# Patient Record
Sex: Female | Born: 2000 | Race: Black or African American | Hispanic: No | Marital: Single | Smoking: Never smoker
Health system: Southern US, Community
[De-identification: ages and names within clinical notes are randomized; demographics above are authoritative.]

## PROBLEM LIST (undated history)

## (undated) ENCOUNTER — Inpatient Hospital Stay (HOSPITAL_COMMUNITY): Payer: Self-pay

## (undated) DIAGNOSIS — O210 Mild hyperemesis gravidarum: Secondary | ICD-10-CM

## (undated) DIAGNOSIS — F819 Developmental disorder of scholastic skills, unspecified: Secondary | ICD-10-CM

## (undated) DIAGNOSIS — R87629 Unspecified abnormal cytological findings in specimens from vagina: Secondary | ICD-10-CM

## (undated) HISTORY — DX: Developmental disorder of scholastic skills, unspecified: F81.9

---
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
## 2018-01-01 NOTE — BH Specialist Note (Deleted)
Integrated Behavioral Health Follow Up Visit  MRN: [id number] Name: Deanna Cooper  Number of Integrated Behavioral Health Clinician visits: 2/6 Session Start time: ***  Session End time: *** Total time: {IBH Total Time:21014050}  Type of Service: Integrated Behavioral Health- Individual/Family Interpretor:{yes [id number]}no:[id number]} Interpretor Name and Language: ***  SUBJECTIVE: Deanna Gottroniye Peer is a 17 y.o. female accompanied by {Patient accompanied [phone]} Patient was referred by *** for ***. Patient reports the following symptoms/concerns: *** Duration of problem: ***; Severity of problem: {Mild/Moderate/Severe:20260}  OBJECTIVE: Mood: {BHH MOOD:22306} and Affect: {BHH AFFECT:22307} Risk of harm to self or others: {CHL AMB BH Suicide Current Mental Status:21022748}  LIFE CONTEXT: Family and Social: *** School/Work: *** Self-Care: *** Life Changes: ***  GOALS ADDRESSED: Patient will: 1.  Reduce symptoms of: {IBH Symptoms:21014056}  2.  Increase knowledge and/or ability of: {IBH Patient Tools:21014057}  3.  Demonstrate ability to: {IBH Goals:21014053}  INTERVENTIONS: Interventions utilized:  {IBH Interventions:21014054} Standardized Assessments completed: {IBH Screening Tools:21014051}  ASSESSMENT: Patient currently experiencing ***.   Patient may benefit from ***.  PLAN: 1. Follow up with behavioral health clinician on : *** 2. Behavioral recommendations: *** 3. Referral(s): {IBH Referrals:21014055} 4. "From scale of 1-10, how likely are you to follow plan?": ***  Valetta CloseJamie C McMannes, LCSW

## 2018-01-02 ENCOUNTER — Ambulatory Visit: Payer: Medicaid Other

## 2018-05-15 DIAGNOSIS — Z1388 Encounter for screening for disorder due to exposure to contaminants: Secondary | ICD-10-CM | POA: Diagnosis not present

## 2018-05-15 DIAGNOSIS — Z3009 Encounter for other general counseling and advice on contraception: Secondary | ICD-10-CM | POA: Diagnosis not present

## 2018-05-15 DIAGNOSIS — Z0389 Encounter for observation for other suspected diseases and conditions ruled out: Secondary | ICD-10-CM | POA: Diagnosis not present

## 2018-07-18 DIAGNOSIS — Z113 Encounter for screening for infections with a predominantly sexual mode of transmission: Secondary | ICD-10-CM | POA: Diagnosis not present

## 2018-07-18 DIAGNOSIS — Z3009 Encounter for other general counseling and advice on contraception: Secondary | ICD-10-CM | POA: Diagnosis not present

## 2018-07-18 DIAGNOSIS — Z0389 Encounter for observation for other suspected diseases and conditions ruled out: Secondary | ICD-10-CM | POA: Diagnosis not present

## 2018-07-18 DIAGNOSIS — Z1388 Encounter for screening for disorder due to exposure to contaminants: Secondary | ICD-10-CM | POA: Diagnosis not present

## 2018-08-23 DIAGNOSIS — A749 Chlamydial infection, unspecified: Secondary | ICD-10-CM | POA: Diagnosis not present

## 2018-11-08 DIAGNOSIS — Z113 Encounter for screening for infections with a predominantly sexual mode of transmission: Secondary | ICD-10-CM | POA: Diagnosis not present

## 2019-01-21 ENCOUNTER — Encounter (HOSPITAL_COMMUNITY): Payer: Self-pay | Admitting: Emergency Medicine

## 2019-01-21 ENCOUNTER — Emergency Department (HOSPITAL_COMMUNITY)
Admission: EM | Admit: 2019-01-21 | Discharge: 2019-01-21 | Disposition: A | Discharge status: Home / Self Care | Payer: Medicaid Other | Attending: Emergency Medicine | Admitting: Emergency Medicine

## 2019-01-21 ENCOUNTER — Other Ambulatory Visit: Payer: Self-pay

## 2019-01-21 DIAGNOSIS — Z0279 Encounter for issue of other medical certificate: Secondary | ICD-10-CM | POA: Diagnosis present

## 2019-01-21 DIAGNOSIS — Z87898 Personal history of other specified conditions: Secondary | ICD-10-CM | POA: Diagnosis not present

## 2019-01-21 DIAGNOSIS — Z79899 Other long term (current) drug therapy: Secondary | ICD-10-CM | POA: Diagnosis not present

## 2019-01-21 DIAGNOSIS — Z0289 Encounter for other administrative examinations: Secondary | ICD-10-CM | POA: Diagnosis not present

## 2019-01-21 NOTE — ED Notes (Signed)
Patient verbalizes understanding of discharge instructions. Opportunity for questioning and answers were provided. Armband removed by staff, pt discharged from ED.  

## 2019-01-21 NOTE — ED Provider Notes (Signed)
[facility]   CSN: [id number] Arrival date & time: 01/21/19  1309     History   Chief Complaint Chief Complaint  Patient presents with  . Needs Work Note    HPI Ruben Gottroniye Gleghorn is a 18 y.o. female with no pertinent past medical history presents today requesting a note to return to work.  She reports that yesterday she was having eye pain and called out of work and now they are not letting her return until she has a note.  She states that her eye pain has fully resolved.  She denies any specific interventions that made it resolved.  She denies any trauma or vision changes.  She says that it started in one eye however would switch over to the other eye and back and forth.  She denies any recent trauma, weakness, difficulty speaking, moving or other concerns.  She says that she feels back to her baseline now.     HPI  Past Medical History:  Diagnosis Date  . Hyperemesis affecting pregnancy, antepartum   . Learning disability    Dx age 89 yo    Patient Active Problem List   Diagnosis Date Noted  . Nexplanon insertion 11/07/2017  . 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 Vaginal bleeding in pregnancy, third trimester 11/05/2017  . Status post primary low transverse cesarean section 11/05/2017  . Prenatal care in third trimester 11/03/2017  . Positive GBS test 10/24/2017  . Anemia affecting pregnancy 08/29/2017  . Chlamydia infection affecting pregnancy in first trimester, antepartum 05/19/2017  . Supervision of normal first pregnancy, antepartum 04/21/2017  . Supervision of normal first teen pregnancy, unspecified trimester 04/21/2017    Past Surgical History:  Procedure Laterality Date  . CESAREAN SECTION N/A 11/05/2017   Procedure: CESAREAN SECTION;  Surgeon: Reva BoresPratt, Tanya S, MD;  [facility];  Service: Obstetrics;  Laterality: N/A;     OB History    Gravida  1   Para  1   Term  1   Preterm      AB      Living  1     SAB      TAB      Ectopic      Multiple  0   Live Births  1            Home Medications    Prior to Admission medications   Medication Sig Start Date End Date Taking? Authorizing Provider  acetaminophen (TYLENOL) 500 MG tablet Take 500 mg by mouth every 6 (six) hours as needed for mild pain or headache.    [provider]  diphenhydrAMINE (BENADRYL) 25 MG tablet Take 25 mg by mouth at bedtime as needed for sleep.    [provider]  doxylamine, Sleep, (UNISOM) 25 MG tablet Take 1 tablet (25 mg total) by mouth at bedtime as needed. 05/08/17   Donette LarryBhambri, Melanie, CNM  ferrous sulfate (FERROUSUL) 325 (65 FE) MG tablet Take 1 tablet (325 mg total) by mouth 2 (two) times daily. 09/08/17   Amedeo GoryKarim-Rhoades, [name] N, CNM  Prenatal Vit-Fe Fumarate-FA (PRENATAL MULTIVITAMIN) TABS tablet Take 1 tablet by mouth daily at 12 noon.    [provider]    Family History Family History  Problem Relation Age of Onset  . Diabetes Mother   . Hypertension Mother   . Hyperlipidemia Mother   . Thalassemia Brother     Social History Social History   Tobacco Use  .  Smoking status: Never Smoker  . Smokeless tobacco: Never Used  Substance Use Topics  . Alcohol use: No    Frequency: Never  . Drug use: No     Allergies   Patient has no known allergies.   Review of Systems Review of Systems  Constitutional: Negative for chills and fever.  HENT: Negative for congestion and sore throat.   Eyes: Negative for photophobia and visual disturbance.  Respiratory: Negative for cough and shortness of breath.   Cardiovascular: Negative for chest pain.  Gastrointestinal: Negative for abdominal distention, abdominal pain, diarrhea, nausea and vomiting.  Musculoskeletal: Negative for arthralgias, back pain, myalgias and neck pain.  Neurological: Negative for speech difficulty, weakness, numbness and headaches (Had yeterday, has resolved).   Psychiatric/Behavioral: Negative for confusion.  All other systems reviewed and are negative.    Physical Exam Updated Vital Signs BP 122/73 (BP Location: Left Arm)   Pulse 66   Temp 99.1 F (37.3 C) (Oral)   Resp 16   Ht 5\' 1"  (1.549 m)   Wt 45.4 kg   LMP 12/07/2018 (Exact Date)   SpO2 100%   BMI 18.89 kg/m   Physical Exam Vitals signs and nursing note reviewed.  Constitutional:      General: She is not in acute distress.    Appearance: She is not ill-appearing.  HENT:     Head: Normocephalic and atraumatic.     Mouth/Throat:     Mouth: Mucous membranes are moist.  Eyes:     General: No scleral icterus.       Right eye: No discharge.        Left eye: No discharge.     Extraocular Movements: Extraocular movements intact.     Conjunctiva/sclera: Conjunctivae normal.     Pupils: Pupils are equal, round, and reactive to light.  Neck:     Musculoskeletal: Normal range of motion and neck supple. No neck rigidity.  Cardiovascular:     Rate and Rhythm: Normal rate.  Pulmonary:     Effort: Pulmonary effort is normal. No respiratory distress.  Lymphadenopathy:     Cervical: No cervical adenopathy.  Neurological:     Mental Status: She is alert and oriented to person, place, and time.     Comments: 5/5 strength bilateral upper and lower extremities.  Speech is not slurred, she is able to coherently provide history.  Psychiatric:     Comments: Affect is flat, however otherwise normal mood and behavior.      ED Treatments / Results  Labs (all labs ordered are listed, but only abnormal results are displayed) Labs Reviewed - No data to display  EKG None  Radiology No results found.  Procedures Procedures (including critical care time)  Medications Ordered in ED Medications - No data to display   Initial Impression / Assessment and Plan / ED Course  I have reviewed the triage vital signs and the nursing notes.  Pertinent labs & imaging results that were  available during my care of the patient were reviewed by me and considered in my medical decision making (see chart for details).       Patient presents today requesting a return to work note.  She reportedly called out from her job at [employer] yesterday due to headache and is not allowed back until she has a note.  She states that at this time she does not have any symptoms.  She denies any known coronavirus contacts or symptoms other than headache of coronavirus.  She is afebrile.  She was offered coronavirus testing however declined.  She is given a return to work note stating that as long as she is asymptomatic she may return.  Return precautions were discussed with patient who states their understanding.  At the time of discharge patient denied any unaddressed complaints or concerns.  Patient is agreeable for discharge home.   Final Clinical Impressions(s) / ED Diagnoses   Final diagnoses:  History of headache    ED Discharge Orders    None       Lorin Glass, Hershal Coria 01/21/19 1545    Sherwood Gambler, MD 01/23/19 [id number]

## 2019-01-21 NOTE — ED Triage Notes (Signed)
Pt here for a note to be able to return to work. Pt reports calling out yesterday because she was "sick" and now they won't allow her to go back to work.

## 2019-01-21 NOTE — Discharge Instructions (Addendum)
If you have a recurrent headache or any other concerns please seek additional medical care and evaluation.   I would recommend that you follow-up with your primary care doctor.

## 2019-03-18 DIAGNOSIS — Z113 Encounter for screening for infections with a predominantly sexual mode of transmission: Secondary | ICD-10-CM | POA: Diagnosis not present

## 2019-03-18 DIAGNOSIS — Z7251 High risk heterosexual behavior: Secondary | ICD-10-CM | POA: Diagnosis not present

## 2019-07-31 DIAGNOSIS — Z3046 Encounter for surveillance of implantable subdermal contraceptive: Secondary | ICD-10-CM | POA: Diagnosis not present

## 2019-07-31 DIAGNOSIS — Z3009 Encounter for other general counseling and advice on contraception: Secondary | ICD-10-CM | POA: Diagnosis not present

## 2019-10-24 ENCOUNTER — Other Ambulatory Visit: Payer: Self-pay

## 2019-10-24 ENCOUNTER — Emergency Department (HOSPITAL_COMMUNITY)
Admission: EM | Admit: 2019-10-24 | Discharge: 2019-10-24 | Disposition: A | Discharge status: Home / Self Care | Payer: Medicaid Other | Attending: Emergency Medicine | Admitting: Emergency Medicine

## 2019-10-24 DIAGNOSIS — R109 Unspecified abdominal pain: Secondary | ICD-10-CM | POA: Diagnosis not present

## 2019-10-24 DIAGNOSIS — Z79899 Other long term (current) drug therapy: Secondary | ICD-10-CM | POA: Insufficient documentation

## 2019-10-24 DIAGNOSIS — R1031 Right lower quadrant pain: Secondary | ICD-10-CM | POA: Diagnosis not present

## 2019-10-24 LAB — URINALYSIS, ROUTINE W REFLEX MICROSCOPIC
Bacteria, UA: NONE SEEN
Bilirubin Urine: NEGATIVE
Glucose, UA: NEGATIVE mg/dL
Hgb urine dipstick: NEGATIVE
Ketones, ur: NEGATIVE mg/dL
Nitrite: NEGATIVE
Protein, ur: NEGATIVE mg/dL
Specific Gravity, Urine: 1.017 (ref 1.005–1.030)
pH: 7 (ref 5.0–8.0)

## 2019-10-24 LAB — COMPREHENSIVE METABOLIC PANEL
ALT: 15 U/L (ref 0–44)
AST: 15 U/L (ref 15–41)
Albumin: 3.9 g/dL (ref 3.5–5.0)
Alkaline Phosphatase: 42 U/L (ref 38–126)
Anion gap: 7 (ref 5–15)
BUN: <5 mg/dL — ABNORMAL LOW (ref 6–20)
CO2: 25 mmol/L (ref 22–32)
Calcium: 9.3 mg/dL (ref 8.9–10.3)
Chloride: 104 mmol/L (ref 98–111)
Creatinine, Ser: 0.61 mg/dL (ref 0.44–1.00)
GFR calc Af Amer: >60 mL/min (ref >60)
GFR calc non Af Amer: >60 mL/min (ref >60)
Glucose, Bld: 97 mg/dL (ref 70–99)
Potassium: 3.5 mmol/L (ref 3.5–5.1)
Sodium: 136 mmol/L (ref 135–145)
Total Bilirubin: 0.6 mg/dL (ref 0.3–1.2)
Total Protein: 7 g/dL (ref 6.5–8.1)

## 2019-10-24 LAB — I-STAT BETA HCG BLOOD, ED (MC, WL, AP ONLY): I-stat hCG, quantitative: <5 m[IU]/mL (ref <5)

## 2019-10-24 LAB — CBC
HCT: 35.1 % — ABNORMAL LOW (ref 36.0–46.0)
Hemoglobin: 11.7 g/dL — ABNORMAL LOW (ref 12.0–15.0)
MCH: 31.7 pg (ref 26.0–34.0)
MCHC: 33.3 g/dL (ref 30.0–36.0)
MCV: 95.1 fL (ref 80.0–100.0)
Platelets: 317 10*3/uL (ref 150–400)
RBC: 3.69 MIL/uL — ABNORMAL LOW (ref 3.87–5.11)
RDW: 11.9 % (ref 11.5–15.5)
WBC: 7.1 10*3/uL (ref 4.0–10.5)
nRBC: 0 % (ref 0.0–0.2)

## 2019-10-24 LAB — LIPASE, BLOOD: Lipase: 21 U/L (ref 11–51)

## 2019-10-24 MED ORDER — ACETAMINOPHEN 500 MG PO TABS
1000.0000 mg | ORAL_TABLET | Freq: Once | ORAL | Status: AC
  Administered 2019-10-24: 1000 mg via ORAL
  Filled 2019-10-24: qty 2

## 2019-10-24 MED ORDER — SODIUM CHLORIDE 0.9% FLUSH: 3.0000 mL | Freq: Once | INTRAVENOUS | Status: DC

## 2019-10-24 NOTE — ED Notes (Signed)
Patient verbalizes understanding of discharge instructions. Opportunity for questioning and answers were provided. Armband removed by staff, pt discharged from ED.  

## 2019-10-24 NOTE — ED Provider Notes (Signed)
[facility] EMERGENCY DEPARTMENT Provider Note   CSN: [id number] Arrival date & time: 10/24/19  1619     History Chief Complaint  Patient presents with  . Abdominal Pain    Deanna Cooper is a 18 y.o. female presents to the ER for evaluation of abdominal pain.  This began 1 week ago.  Pain is located in the right mid abdomen and sometimes radiates down to her right lower and suprapubic abdomen.  The pain is intermittent, mild but states she does not have any pain right now.  No flank pain.  She is concerned about pregnancy and states that 2 days ago she had 1 faint positive pregnancy test.  She took a second pregnancy test and this was negative.  Her last menstrual period was April 17.  She is sexually active with men only with inconsistent condom use but states she has not been sexually active in "a long time".  She is is not concerned for an STD.  Over the last several weeks she has noticed fluctuating white and yellow discharge from the vagina but none currently.  States she is not sure because "she does not pay attention".  No associated fever, nausea, vomiting.  No dysuria, hematuria or urgency.  No abnormal vaginal bleeding.  Patient developed a headache while waiting in the waiting room and is requesting Tylenol for it.  No history of kidney stones. HPI     Past Medical History:  Diagnosis Date  . Hyperemesis affecting pregnancy, antepartum   . Learning disability    Dx age 17 yo    Patient Active Problem List   Diagnosis Date Noted  . Nexplanon insertion 11/07/2017  . 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 Vaginal bleeding in pregnancy, third trimester 11/05/2017  . Status post primary low transverse cesarean section 11/05/2017  . Prenatal care in third trimester 11/03/2017  . Positive GBS test 10/24/2017  . Anemia affecting pregnancy 08/29/2017  . Chlamydia infection affecting pregnancy in first trimester, antepartum 05/19/2017  . Supervision of normal first  pregnancy, antepartum 04/21/2017  . Supervision of normal first teen pregnancy, unspecified trimester 04/21/2017    Past Surgical History:  Procedure Laterality Date  . CESAREAN SECTION N/A 11/05/2017   Procedure: CESAREAN SECTION;  Surgeon: Reva Bores, MD;  Location: [facility] BIRTHING SUITES;  Service: Obstetrics;  Laterality: N/A;     OB History    Gravida  1   Para  1   Term  1   Preterm      AB      Living  1     SAB      TAB      Ectopic      Multiple  0   Live Births  1           Family History  Problem Relation Age of Onset  . Diabetes Mother   . Hypertension Mother   . Hyperlipidemia Mother   . Thalassemia Brother     Social History   Tobacco Use  . Smoking status: Never Smoker  . Smokeless tobacco: Never Used  Substance Use Topics  . Alcohol use: No  . Drug use: No    Home Medications Prior to Admission medications   Medication Sig Start Date End Date Taking? Authorizing Provider  acetaminophen (TYLENOL) 500 MG tablet Take 500 mg by mouth every 6 (six) hours as needed for mild pain or headache.    [provider]  diphenhydrAMINE (BENADRYL) 25 MG tablet Take  25 mg by mouth at bedtime as needed for sleep.    [provider]  doxylamine, Sleep, (UNISOM) 25 MG tablet Take 1 tablet (25 mg total) by mouth at bedtime as needed. 05/08/17   Julianne Handler, CNM  ferrous sulfate (FERROUSUL) 325 (65 FE) MG tablet Take 1 tablet (325 mg total) by mouth 2 (two) times daily. 09/08/17   Cyndee Brightly, CNM  Prenatal Vit-Fe Fumarate-FA (PRENATAL MULTIVITAMIN) TABS tablet Take 1 tablet by mouth daily at 12 noon.    [provider]    Allergies    Patient has no known allergies.  Review of Systems   Review of Systems  Gastrointestinal: Positive for abdominal pain.  All other systems reviewed and are negative.   Physical Exam Updated Vital Signs BP (!) 114/51   Pulse 79   Temp 98.3 F (36.8 C) (Oral)   Resp 14    Ht 5\' 1"  (1.549 m)   Wt 49.9 kg   LMP 09/21/2019 (Exact Date)   SpO2 100%   BMI 20.78 kg/m   Physical Exam Vitals and nursing note reviewed.  Constitutional:      Appearance: She is well-developed.     Comments: Non toxic in NAD  HENT:     Head: Normocephalic and atraumatic.     Nose: Nose normal.  Eyes:     Conjunctiva/sclera: Conjunctivae normal.  Cardiovascular:     Rate and Rhythm: Normal rate and regular rhythm.  Pulmonary:     Effort: Pulmonary effort is normal.     Breath sounds: Normal breath sounds.  Abdominal:     General: Bowel sounds are normal.     Palpations: Abdomen is soft.     Tenderness: There is no abdominal tenderness.     Comments: No G/R/R. No suprapubic or CVA tenderness. Negative Murphy's and McBurney's. Active BS to lower quadrants.   Musculoskeletal:        General: Normal range of motion.     Cervical back: Normal range of motion.  Skin:    General: Skin is warm and dry.     Capillary Refill: Capillary refill takes less than 2 seconds.  Neurological:     Mental Status: She is alert.  Psychiatric:        Behavior: Behavior normal.     ED Results / Procedures / Treatments   Labs (all labs ordered are listed, but only abnormal results are displayed) Labs Reviewed  COMPREHENSIVE METABOLIC PANEL - Abnormal; Notable for the following components:      Result Value   BUN <5 (*)    All other components within normal limits  CBC - Abnormal; Notable for the following components:   RBC 3.69 (*)    Hemoglobin 11.7 (*)    HCT 35.1 (*)    All other components within normal limits  URINALYSIS, ROUTINE W REFLEX MICROSCOPIC - Abnormal; Notable for the following components:   APPearance HAZY (*)    Leukocytes,Ua MODERATE (*)    All other components within normal limits  LIPASE, BLOOD  I-STAT BETA HCG BLOOD, ED ([facility], [facility], [facility] ONLY)    EKG None  Radiology No results found.  Procedures Procedures (including critical care time)  Medications  Ordered in ED Medications  sodium chloride flush (NS) 0.9 % injection 3 mL (has no administration in time range)  acetaminophen (TYLENOL) tablet 1,000 mg (1,000 mg Oral Given 10/24/19 1948)    ED Course  I have reviewed the triage vital signs and the nursing notes.  Pertinent labs & imaging results that were available during my care of the patient were reviewed by me and considered in my medical decision making (see chart for details).  Clinical Course as of Oct 23 2012  Thu Oct 24, 2019  1927 Hemoglobin(!): 11.7 [CG]  1928 HCT(!): 35.1 [CG]  1928 I-stat hCG, quantitative: <5.0 [CG]  1928 Glori Luis): MODERATE [CG]  1928 WBC, UA: 6-10 [CG]  1928 Bacteria, UA: NONE SEEN [CG]  1928 Mucus: PRESENT [CG]    Clinical Course User Index [CG] Liberty Handy, PA-C   MDM Rules/Calculators/A&P                       19 year old female presents for intermittent right lower and right mid abdominal pain.  She is concerned about pregnancy because she had an equivocal positive pregnancy test at home with a repeat negative pregnancy test.  Reports some vaginal discharge about states she is not sure if this is any different because she doesn't "paying attention".  She is not concerned about STD.  Patient has absolutely no abdominal tenderness or CVA tenderness on exam.  Afebrile.  ER work-up initiated in triage personally reviewed, as above, personally reviewed and interpreted.  Minimal anemia, stable.  UA has some mucus, moderate leukocytes, 6-10 WBC but no bacteria.  She has no UTI symptoms, suprapubic or CVA tenderness.  Will defer antibiotics at this time given that she has no UTI symptoms, no bacteriuria, is afebrile and has no leukocytosis and we will send urine for culture.  I explained this to patient and she is in agreement.  I considered appendicitis, PID, ruptured ovarian cyst, right-sided diverticulitis however patient has vastly normal lab work and completely benign abdominal exam.   Given this, I do not think there is emergent need for further work-up or imaging.  I explained this to patient and she agrees.  Tylenol was given to her for her headache that she reports is chronic.  I discussed return instructions with patient, she is to return if she has return of pain or constant pain, fever, vomiting, abnormal bowel movements or urinary symptoms.  She is in agreement. Final Clinical Impression(s) / ED Diagnoses Final diagnoses:  Right lateral abdominal pain    Rx / DC Orders ED Discharge Orders    None       Jerrell Mylar 10/24/19 2014    Rolan Bucco, MD 10/24/19 2322

## 2019-10-24 NOTE — ED Triage Notes (Signed)
Pt here for evaluation of R sided abdominal pain and nausea. Sts she took one pregnancy test two days ago that was positive and then another today that was negative.

## 2019-10-24 NOTE — Discharge Instructions (Addendum)
You were seen in the ER for abdominal pain.  Your abdominal pain resolved while in the ER  Lab work was normal.  Urinalysis was contaminated but did not have any bacteria.  You did not have any urinary tract infection symptoms or abdominal pain while in the ER so we will send your urine to the lab for culture to rule in or rule out an infection.  If your urine culture shows bacteria, you will be contacted and antibiotics will be prescribed to you.  Monitor for return of symptoms.  Try Tylenol or ibuprofen for pain.  Return immediately if you have return of abdominal pain that is constant, severe or if you develop fever, vomiting, abnormal bowel movements, urinary symptoms, abnormal vaginal discharge or bleeding

## 2020-01-18 DIAGNOSIS — Z202 Contact with and (suspected) exposure to infections with a predominantly sexual mode of transmission: Secondary | ICD-10-CM | POA: Diagnosis not present

## 2020-01-18 DIAGNOSIS — Z20822 Contact with and (suspected) exposure to covid-19: Secondary | ICD-10-CM | POA: Diagnosis not present

## 2020-01-18 DIAGNOSIS — J069 Acute upper respiratory infection, unspecified: Secondary | ICD-10-CM | POA: Diagnosis not present

## 2020-01-22 DIAGNOSIS — A549 Gonococcal infection, unspecified: Secondary | ICD-10-CM | POA: Diagnosis not present

## 2020-05-13 ENCOUNTER — Encounter (HOSPITAL_BASED_OUTPATIENT_CLINIC_OR_DEPARTMENT_OTHER): Payer: Self-pay | Admitting: *Deleted

## 2020-05-13 ENCOUNTER — Emergency Department (HOSPITAL_BASED_OUTPATIENT_CLINIC_OR_DEPARTMENT_OTHER)
Admission: EM | Admit: 2020-05-13 | Discharge: 2020-05-13 | Disposition: A | Discharge status: Home / Self Care | Payer: Medicaid Other | Attending: Emergency Medicine | Admitting: Emergency Medicine

## 2020-05-13 ENCOUNTER — Other Ambulatory Visit: Payer: Self-pay

## 2020-05-13 DIAGNOSIS — Z3491 Encounter for supervision of normal pregnancy, unspecified, first trimester: Secondary | ICD-10-CM | POA: Diagnosis present

## 2020-05-13 DIAGNOSIS — Z3A01 Less than 8 weeks gestation of pregnancy: Secondary | ICD-10-CM

## 2020-05-13 DIAGNOSIS — Z3201 Encounter for pregnancy test, result positive: Secondary | ICD-10-CM | POA: Diagnosis not present

## 2020-05-13 LAB — PREGNANCY, URINE: Preg Test, Ur: POSITIVE — AB

## 2020-05-13 NOTE — ED Provider Notes (Signed)
[facility] EMERGENCY DEPARTMENT Provider Note   CSN: [id number] Arrival date & time: 05/13/20  1126     History Chief Complaint  Patient presents with  . Possible Pregnancy    Deanna Cooper is a 19 y.o. female at approx [redacted] weeks gestation (LMP 04/01/20), G2P1, presenting to emergency department to confirm pregnancy status.  She reports she tested positive at home approximately 1 week ago on a urine pregnancy test.  She comes to the ER today to confirm that she is in fact pregnant.  She wants to get checked out to "make sure breathing is okay".  She denies any abdominal cramping, any vaginal bleeding, and any vomiting.  She has mild early morning sickness.  She reports that her prior pregnancy was uncomplicated except for first trimester morning sickness.  She does not currently have an OB/GYN provider.  She reports the pregnancy was a surprise, but she does intend to keep the pregnancy.  She denies any other medical problems.  She is taking prenatal vitamins.  She does not take any other medications.  She reports she has not been smoking since her pregnancy diagnosis, denies alcohol or other drug use.  HPI     Past Medical History:  Diagnosis Date  . Hyperemesis affecting pregnancy, antepartum   . Learning disability    Dx age 65 yo    Patient Active Problem List   Diagnosis Date Noted  . Nexplanon insertion 11/07/2017  . 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 Vaginal bleeding in pregnancy, third trimester 11/05/2017  . Status post primary low transverse cesarean section 11/05/2017  . Prenatal care in third trimester 11/03/2017  . Positive GBS test 10/24/2017  . Anemia affecting pregnancy 08/29/2017  . Chlamydia infection affecting pregnancy in first trimester, antepartum 05/19/2017  . Supervision of normal first pregnancy, antepartum 04/21/2017  . Supervision of normal first teen pregnancy, unspecified trimester 04/21/2017    Past Surgical History:  Procedure  Laterality Date  . CESAREAN SECTION N/A 11/05/2017   Procedure: CESAREAN SECTION;  Surgeon: Reva Bores, MD;  Location: [facility] BIRTHING SUITES;  Service: Obstetrics;  Laterality: N/A;     OB History    Gravida  2   Para  1   Term  1   Preterm      AB      Living  1     SAB      TAB      Ectopic      Multiple  0   Live Births  1           Family History  Problem Relation Age of Onset  . Diabetes Mother   . Hypertension Mother   . Hyperlipidemia Mother   . Thalassemia Brother     Social History   Tobacco Use  . Smoking status: Never Smoker  . Smokeless tobacco: Never Used  Substance Use Topics  . Alcohol use: No  . Drug use: No    Home Medications Prior to Admission medications   Medication Sig Start Date End Date Taking? Authorizing Provider  acetaminophen (TYLENOL) 500 MG tablet Take 500 mg by mouth every 6 (six) hours as needed for mild pain or headache.    [provider]  diphenhydrAMINE (BENADRYL) 25 MG tablet Take 25 mg by mouth at bedtime as needed for sleep.    [provider]  doxylamine, Sleep, (UNISOM) 25 MG tablet Take 1 tablet (25 mg total) by mouth at bedtime as needed. 05/08/17   Bhambri,  Melanie, CNM  ferrous sulfate (FERROUSUL) 325 (65 FE) MG tablet Take 1 tablet (325 mg total) by mouth 2 (two) times daily. 09/08/17   Amedeo Gory, CNM  Prenatal Vit-Fe Fumarate-FA (PRENATAL MULTIVITAMIN) TABS tablet Take 1 tablet by mouth daily at 12 noon.    [provider]    Allergies    Patient has no known allergies.  Review of Systems   Review of Systems  Constitutional: Negative for chills and fever.  Eyes: Negative for pain and visual disturbance.  Respiratory: Negative for cough and shortness of breath.   Cardiovascular: Negative for chest pain and palpitations.  Gastrointestinal: Positive for nausea. Negative for abdominal pain and vomiting.  Genitourinary: Negative for dysuria, hematuria, pelvic pain,  vaginal bleeding and vaginal discharge.  Musculoskeletal: Negative for arthralgias and back pain.  Skin: Negative for color change and rash.  Neurological: Negative for syncope and headaches.  All other systems reviewed and are negative.   Physical Exam Updated Vital Signs BP 116/69 (BP Location: Right Arm)   Pulse 86   Temp 99.1 F (37.3 C) (Oral)   Resp 16   Ht 5\' 1"  (1.549 m)   Wt 49.9 kg   LMP 04/01/2020   SpO2 100%   BMI 20.78 kg/m   Physical Exam Vitals and nursing note reviewed.  Constitutional:      General: She is not in acute distress.    Appearance: She is well-developed.  HENT:     Head: Normocephalic and atraumatic.  Eyes:     Conjunctiva/sclera: Conjunctivae normal.  Cardiovascular:     Rate and Rhythm: Normal rate and regular rhythm.     Pulses: Normal pulses.  Pulmonary:     Effort: Pulmonary effort is normal. No respiratory distress.     Breath sounds: Normal breath sounds.  Abdominal:     Palpations: Abdomen is soft.     Tenderness: There is no abdominal tenderness.  Musculoskeletal:     Cervical back: Neck supple.  Skin:    General: Skin is warm and dry.  Neurological:     General: No focal deficit present.     Mental Status: She is alert and oriented to person, place, and time.  Psychiatric:        Mood and Affect: Mood normal.        Behavior: Behavior normal.     ED Results / Procedures / Treatments   Labs (all labs ordered are listed, but only abnormal results are displayed) Labs Reviewed  PREGNANCY, URINE - Abnormal; Notable for the following components:      Result Value   Preg Test, Ur POSITIVE (*)    All other components within normal limits    EKG None  Radiology No results found.  Procedures Procedures (including critical care time)  Medications Ordered in ED Medications - No data to display  ED Course  I have reviewed the triage vital signs and the nursing notes.  Pertinent labs & imaging results that were  available during my care of the patient were reviewed by me and considered in my medical decision making (see chart for details).  19 year old female presented emergency department with no acute complaints.  She wants confirmation of her pregnancy test.  She has confirmed her pregnancy.  Gestational age is approximately 5 to 6 weeks by last menstrual period.  No signs or symptoms of threatened miscarriage or infection at this time.  She is well-appearing.  She will need to establish care with an OB/GYN provider  and I offered her an office number.  She is already taking MultiNatal vitamins.  We discussed again not drinking or smoking or using recreational drugs.  She is not taking any other medications at this time.  Okay for discharge.    Final Clinical Impression(s) / ED Diagnoses Final diagnoses:  Less than [redacted] weeks gestation of pregnancy    Rx / DC Orders ED Discharge Orders    None       Rhesa Forsberg, Kermit Balo, MD 05/13/20 1316

## 2020-05-13 NOTE — ED Triage Notes (Signed)
Took a pregnancy test this past Tuesday +.  Wants to know if she is okay?

## 2020-05-13 NOTE — Discharge Instructions (Signed)
[facility] to schedule an office appointment for your pregnancy.  It is very important that your pregnancy is managed and followed by an OBGYN provider.

## 2020-05-27 ENCOUNTER — Encounter (HOSPITAL_BASED_OUTPATIENT_CLINIC_OR_DEPARTMENT_OTHER): Payer: Self-pay

## 2020-05-27 ENCOUNTER — Emergency Department (HOSPITAL_BASED_OUTPATIENT_CLINIC_OR_DEPARTMENT_OTHER): Payer: Medicaid Other

## 2020-05-27 ENCOUNTER — Emergency Department (HOSPITAL_BASED_OUTPATIENT_CLINIC_OR_DEPARTMENT_OTHER)
Admission: EM | Admit: 2020-05-27 | Discharge: 2020-05-27 | Disposition: A | Discharge status: Home / Self Care | Payer: Medicaid Other | Attending: Emergency Medicine | Admitting: Emergency Medicine

## 2020-05-27 ENCOUNTER — Other Ambulatory Visit: Payer: Self-pay

## 2020-05-27 DIAGNOSIS — O219 Vomiting of pregnancy, unspecified: Secondary | ICD-10-CM | POA: Diagnosis not present

## 2020-05-27 DIAGNOSIS — O26891 Other specified pregnancy related conditions, first trimester: Secondary | ICD-10-CM | POA: Diagnosis not present

## 2020-05-27 DIAGNOSIS — Z20822 Contact with and (suspected) exposure to covid-19: Secondary | ICD-10-CM | POA: Insufficient documentation

## 2020-05-27 DIAGNOSIS — O99281 Endocrine, nutritional and metabolic diseases complicating pregnancy, first trimester: Secondary | ICD-10-CM | POA: Diagnosis not present

## 2020-05-27 DIAGNOSIS — Z3A01 Less than 8 weeks gestation of pregnancy: Secondary | ICD-10-CM | POA: Insufficient documentation

## 2020-05-27 DIAGNOSIS — O210 Mild hyperemesis gravidarum: Secondary | ICD-10-CM

## 2020-05-27 DIAGNOSIS — E876 Hypokalemia: Secondary | ICD-10-CM

## 2020-05-27 DIAGNOSIS — O26899 Other specified pregnancy related conditions, unspecified trimester: Secondary | ICD-10-CM

## 2020-05-27 LAB — URINALYSIS, MICROSCOPIC (REFLEX): RBC / HPF: NONE SEEN RBC/hpf (ref 0–5)

## 2020-05-27 LAB — BASIC METABOLIC PANEL
Anion gap: 13 (ref 5–15)
BUN: 9 mg/dL (ref 6–20)
CO2: 21 mmol/L — ABNORMAL LOW (ref 22–32)
Calcium: 9.3 mg/dL (ref 8.9–10.3)
Chloride: 96 mmol/L — ABNORMAL LOW (ref 98–111)
Creatinine, Ser: 0.7 mg/dL (ref 0.44–1.00)
GFR, Estimated: >60 mL/min (ref >60)
Glucose, Bld: 112 mg/dL — ABNORMAL HIGH (ref 70–99)
Potassium: 3 mmol/L — ABNORMAL LOW (ref 3.5–5.1)
Sodium: 130 mmol/L — ABNORMAL LOW (ref 135–145)

## 2020-05-27 LAB — URINALYSIS, ROUTINE W REFLEX MICROSCOPIC
Bilirubin Urine: NEGATIVE
Glucose, UA: NEGATIVE mg/dL
Hgb urine dipstick: NEGATIVE
Ketones, ur: 80 mg/dL — AB
Nitrite: NEGATIVE
Protein, ur: 30 mg/dL — AB
Specific Gravity, Urine: 1.015 (ref 1.005–1.030)
pH: 7.5 (ref 5.0–8.0)

## 2020-05-27 LAB — CBC WITH DIFFERENTIAL/PLATELET
Abs Immature Granulocytes: 0.07 K/uL (ref 0.00–0.07)
Basophils Absolute: 0 K/uL (ref 0.0–0.1)
Basophils Relative: 0 %
Eosinophils Absolute: 0 K/uL (ref 0.0–0.5)
Eosinophils Relative: 0 %
HCT: 35.9 % — ABNORMAL LOW (ref 36.0–46.0)
Hemoglobin: 12.8 g/dL (ref 12.0–15.0)
Immature Granulocytes: 1 %
Lymphocytes Relative: 11 %
Lymphs Abs: 1.2 K/uL (ref 0.7–4.0)
MCH: 32.2 pg (ref 26.0–34.0)
MCHC: 35.7 g/dL (ref 30.0–36.0)
MCV: 90.4 fL (ref 80.0–100.0)
Monocytes Absolute: 0.7 K/uL (ref 0.1–1.0)
Monocytes Relative: 7 %
Neutro Abs: 8.8 K/uL — ABNORMAL HIGH (ref 1.7–7.7)
Neutrophils Relative %: 81 %
Platelets: 437 K/uL — ABNORMAL HIGH (ref 150–400)
RBC: 3.97 MIL/uL (ref 3.87–5.11)
RDW: 12.2 % (ref 11.5–15.5)
WBC: 10.8 K/uL — ABNORMAL HIGH (ref 4.0–10.5)
nRBC: 0 % (ref 0.0–0.2)

## 2020-05-27 LAB — RESP PANEL BY RT-PCR (FLU A&B, COVID) ARPGX2
Influenza A by PCR: NEGATIVE
Influenza B by PCR: NEGATIVE
SARS Coronavirus 2 by RT PCR: NEGATIVE

## 2020-05-27 LAB — PREGNANCY, URINE: Preg Test, Ur: NEGATIVE

## 2020-05-27 LAB — HCG, QUANTITATIVE, PREGNANCY: hCG, Beta Chain, Quant, S: 425730 m[IU]/mL — ABNORMAL HIGH

## 2020-05-27 MED ORDER — POTASSIUM CHLORIDE 20 MEQ PO PACK
40.0000 meq | PACK | Freq: Two times a day (BID) | ORAL | 0 refills | Status: DC
Start: 2020-05-27 — End: 2020-07-03

## 2020-05-27 MED ORDER — ONDANSETRON HCL 4 MG/2ML IJ SOLN
4.0000 mg | Freq: Once | INTRAMUSCULAR | Status: AC
  Administered 2020-05-27: 4 mg via INTRAVENOUS
  Filled 2020-05-27: qty 2

## 2020-05-27 MED ORDER — FAMOTIDINE IN NACL 20-0.9 MG/50ML-% IV SOLN
20.0000 mg | Freq: Once | INTRAVENOUS | Status: AC
  Administered 2020-05-27: 20 mg via INTRAVENOUS
  Filled 2020-05-27: qty 50

## 2020-05-27 MED ORDER — LACTATED RINGERS IV BOLUS
1000.0000 mL | Freq: Once | INTRAVENOUS | Status: AC
  Administered 2020-05-27: 1000 mL via INTRAVENOUS

## 2020-05-27 MED ORDER — POTASSIUM CHLORIDE 20 MEQ PO PACK
40.0000 meq | PACK | Freq: Once | ORAL | Status: AC
  Administered 2020-05-27: 40 meq via ORAL
  Filled 2020-05-27: qty 2

## 2020-05-27 MED ORDER — POTASSIUM CHLORIDE CRYS ER 20 MEQ PO TBCR
40.0000 meq | EXTENDED_RELEASE_TABLET | Freq: Once | ORAL | Status: DC
  Filled 2020-05-27: qty 2

## 2020-05-27 MED ORDER — METOCLOPRAMIDE HCL 5 MG/ML IJ SOLN
10.0000 mg | Freq: Once | INTRAMUSCULAR | Status: AC
  Administered 2020-05-27: 10 mg via INTRAVENOUS
  Filled 2020-05-27: qty 2

## 2020-05-27 MED ORDER — SODIUM CHLORIDE 0.9 % IV BOLUS
1000.0000 mL | Freq: Once | INTRAVENOUS | Status: AC
  Administered 2020-05-27: 1000 mL via INTRAVENOUS

## 2020-05-27 MED ORDER — PROMETHAZINE HCL 25 MG/ML IJ SOLN
25.0000 mg | Freq: Once | INTRAMUSCULAR | Status: AC
  Administered 2020-05-27: 25 mg via INTRAVENOUS
  Filled 2020-05-27: qty 1

## 2020-05-27 MED ORDER — PROMETHAZINE HCL 25 MG PO TABS
25.0000 mg | ORAL_TABLET | Freq: Four times a day (QID) | ORAL | 0 refills | Status: DC | PRN
Start: 2020-05-27 — End: 2020-06-12

## 2020-05-27 NOTE — ED Notes (Signed)
Pt stated she was nauseous and not able to take PO potassium. MD aware.

## 2020-05-27 NOTE — Discharge Instructions (Addendum)
If you develop worsening, continued, or recurrent abdominal pain, uncontrolled vomiting, fever, chest or back pain, or any other new/concerning symptoms then return to the ER for evaluation.  

## 2020-05-27 NOTE — ED Provider Notes (Signed)
[facility] EMERGENCY DEPARTMENT Provider Note   CSN: [id number] Arrival date & time: 05/27/20  1251     History Chief Complaint  Patient presents with  . Vomiting    Deanna Cooper is a 19 y.o. female.  Patient presents with persistent nausea and vomiting unable to keep down any fluids.  She states that she is approximately [redacted] weeks pregnant.  Has an appointment with an OB/GYN doctor the name but she does not recall in about a month.  She has tried crackers and ginger and lemon's but nothing seems to be working for her.  Denies any headache or chest pain or abdominal pain.  Denies diarrhea denies fevers.        Past Medical History:  Diagnosis Date  . Hyperemesis affecting pregnancy, antepartum   . Learning disability    Dx age 76 yo    Patient Active Problem List   Diagnosis Date Noted  . Nexplanon insertion 11/07/2017  . 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 Vaginal bleeding in pregnancy, third trimester 11/05/2017  . Status post primary low transverse cesarean section 11/05/2017  . Prenatal care in third trimester 11/03/2017  . Positive GBS test 10/24/2017  . Anemia affecting pregnancy 08/29/2017  . Chlamydia infection affecting pregnancy in first trimester, antepartum 05/19/2017  . Supervision of normal first pregnancy, antepartum 04/21/2017  . Supervision of normal first teen pregnancy, unspecified trimester 04/21/2017    Past Surgical History:  Procedure Laterality Date  . CESAREAN SECTION N/A 11/05/2017   Procedure: CESAREAN SECTION;  Surgeon: Reva Bores, MD;  Location: [facility] BIRTHING SUITES;  Service: Obstetrics;  Laterality: N/A;     OB History    Gravida  2   Para  1   Term  1   Preterm      AB      Living  1     SAB      IAB      Ectopic      Multiple  0   Live Births  1           Family History  Problem Relation Age of Onset  . Diabetes Mother   . Hypertension Mother   . Hyperlipidemia Mother   . Thalassemia Brother      Social History   Tobacco Use  . Smoking status: Never Smoker  . Smokeless tobacco: Never Used  Substance Use Topics  . Alcohol use: No  . Drug use: No    Home Medications Prior to Admission medications   Medication Sig Start Date End Date Taking? Authorizing Provider  acetaminophen (TYLENOL) 500 MG tablet Take 500 mg by mouth every 6 (six) hours as needed for mild pain or headache.    [provider]  diphenhydrAMINE (BENADRYL) 25 MG tablet Take 25 mg by mouth at bedtime as needed for sleep.    [provider]  doxylamine, Sleep, (UNISOM) 25 MG tablet Take 1 tablet (25 mg total) by mouth at bedtime as needed. 05/08/17   Donette Larry, CNM  ferrous sulfate (FERROUSUL) 325 (65 FE) MG tablet Take 1 tablet (325 mg total) by mouth 2 (two) times daily. 09/08/17   Amedeo Gory, CNM  Prenatal Vit-Fe Fumarate-FA (PRENATAL MULTIVITAMIN) TABS tablet Take 1 tablet by mouth daily at 12 noon.    [provider]    Allergies    Patient has no known allergies.  Review of Systems   Review of Systems  Constitutional: Negative for fever.  HENT: Negative for ear  pain.   Eyes: Negative for pain.  Respiratory: Negative for cough.   Cardiovascular: Negative for chest pain.  Gastrointestinal: Negative for abdominal pain.  Genitourinary: Negative for flank pain.  Musculoskeletal: Negative for back pain.  Skin: Negative for rash.  Neurological: Negative for headaches.    Physical Exam Updated Vital Signs BP 126/72 (BP Location: Left Arm)   Pulse 75   Temp 98.9 F (37.2 C) (Oral)   Resp 18   LMP 04/01/2020   SpO2 100%   Physical Exam Constitutional:      General: She is not in acute distress.    Appearance: Normal appearance.  HENT:     Head: Normocephalic.     Nose: Nose normal.  Eyes:     Extraocular Movements: Extraocular movements intact.  Cardiovascular:     Rate and Rhythm: Normal rate.  Pulmonary:     Effort: Pulmonary effort is  normal.  Abdominal:     Palpations: Abdomen is soft.     Tenderness: There is no abdominal tenderness.  Musculoskeletal:        General: Normal range of motion.     Cervical back: Normal range of motion.  Skin:    General: Skin is warm.  Neurological:     General: No focal deficit present.     Mental Status: She is alert.     ED Results / Procedures / Treatments   Labs (all labs ordered are listed, but only abnormal results are displayed) Labs Reviewed  BASIC METABOLIC PANEL - Abnormal; Notable for the following components:      Result Value   Sodium 130 (*)    Potassium 3.0 (*)    Chloride 96 (*)    CO2 21 (*)    Glucose, Bld 112 (*)    All other components within normal limits  CBC WITH DIFFERENTIAL/PLATELET - Abnormal; Notable for the following components:   WBC 10.8 (*)    HCT 35.9 (*)    Platelets 437 (*)    Neutro Abs 8.8 (*)    All other components within normal limits  RESP PANEL BY RT-PCR (FLU A&B, COVID) ARPGX2  PREGNANCY, URINE  URINALYSIS, ROUTINE W REFLEX MICROSCOPIC    EKG None  Radiology No results found.  Procedures Procedures (including critical care time)  Medications Ordered in ED Medications  potassium chloride SA (KLOR-CON) CR tablet 40 mEq (has no administration in time range)  promethazine (PHENERGAN) injection 25 mg (has no administration in time range)  famotidine (PEPCID) IVPB 20 mg premix (has no administration in time range)  lactated ringers bolus 1,000 mL (has no administration in time range)  sodium chloride 0.9 % bolus 1,000 mL (1,000 mLs Intravenous New Bag/Given 05/27/20 1401)  ondansetron (ZOFRAN) injection 4 mg (4 mg Intravenous Given 05/27/20 1401)    ED Course  I have reviewed the triage vital signs and the nursing notes.  Pertinent labs & imaging results that were available during my care of the patient were reviewed by me and considered in my medical decision making (see chart for details).    MDM  Rules/Calculators/A&P                          Labs appear to show hypokalemia.  Patient given IV fluid hydration.  Given IV Zofran, subsequently still espousing nausea and vomiting.  Case discussed with MA U.  Recommending Phenergan and Pepcid and a liter bolus of LR which was ordered.  Patient be signed  out to oncoming physician provider.   Final Clinical Impression(s) / ED Diagnoses Final diagnoses:  None    Rx / DC Orders ED Discharge Orders    None       Cheryll Cockayne, MD 05/27/20 [phone]

## 2020-05-27 NOTE — ED Triage Notes (Addendum)
Pt c/o n/v-states she is [redacted] weeks pregnant-NAD-to triage in w/c-states she does not have OB appt yet

## 2020-05-27 NOTE — ED Provider Notes (Addendum)
Care transferred to me. Patient states she has been having generalized abdominal pain for a couple weeks. Mild lower abdominal tenderness. Urine preg is now negative, was positive a couple weeks ago. Will send for ultrasound to see if she has an IUP vs ectopic vs retained products given variable pregnancy results. She denies any vaginal bleeding.   HCG is indicative of pregnancy. U/s shows IUP. She is now nauseated and dry-heaving again. Will try reglan. If fails po after this, will need admission.   Given reglan, tolerating some water. Able to tolerate the potassium. Will d/c home with return precautions.   Pricilla Loveless, MD 05/27/20 1959  Of note her urine appears contaminated. Doubt this is a UTI, will send for culture. Given she's pregnant, would need antibiotics if positive.    Pricilla Loveless, MD 05/27/20 2004

## 2020-05-29 LAB — URINE CULTURE: Culture: <10000 — AB

## 2020-06-09 ENCOUNTER — Telehealth: Payer: Self-pay

## 2020-06-09 NOTE — Telephone Encounter (Signed)
Called patient in regards to rescheduling appt today. No answer and left a detailed message to call and reschedule. Patient also has no mychart and could not leave a message in MyChart.  Sherol Dade

## 2020-06-09 NOTE — Telephone Encounter (Signed)
Called patient to reschedule appt today. I called patient and left an detailed message to call us backi and reschedule. Patient also does not have mychart and could not leave a MyChart message to reschedule.  Sherol Dade

## 2020-06-12 ENCOUNTER — Encounter (HOSPITAL_COMMUNITY): Payer: Self-pay | Admitting: Obstetrics and Gynecology

## 2020-06-12 ENCOUNTER — Inpatient Hospital Stay (HOSPITAL_COMMUNITY)
Admission: AD | Admit: 2020-06-12 | Discharge: 2020-06-12 | Disposition: A | Discharge status: Home / Self Care | Payer: Medicaid Other | Attending: Obstetrics and Gynecology | Admitting: Obstetrics and Gynecology

## 2020-06-12 DIAGNOSIS — R112 Nausea with vomiting, unspecified: Secondary | ICD-10-CM | POA: Diagnosis not present

## 2020-06-12 DIAGNOSIS — Z3A10 10 weeks gestation of pregnancy: Secondary | ICD-10-CM | POA: Diagnosis not present

## 2020-06-12 DIAGNOSIS — O210 Mild hyperemesis gravidarum: Secondary | ICD-10-CM | POA: Diagnosis not present

## 2020-06-12 DIAGNOSIS — R1084 Generalized abdominal pain: Secondary | ICD-10-CM | POA: Diagnosis not present

## 2020-06-12 DIAGNOSIS — R110 Nausea: Secondary | ICD-10-CM | POA: Diagnosis not present

## 2020-06-12 DIAGNOSIS — R52 Pain, unspecified: Secondary | ICD-10-CM | POA: Diagnosis not present

## 2020-06-12 LAB — URINALYSIS, ROUTINE W REFLEX MICROSCOPIC
Bacteria, UA: NONE SEEN
Bilirubin Urine: NEGATIVE
Glucose, UA: NEGATIVE mg/dL
Hgb urine dipstick: NEGATIVE
Ketones, ur: 80 mg/dL — AB
Nitrite: NEGATIVE
Protein, ur: 100 mg/dL — AB
Specific Gravity, Urine: 1.028 (ref 1.005–1.030)
pH: 8 (ref 5.0–8.0)

## 2020-06-12 LAB — COMPREHENSIVE METABOLIC PANEL
ALT: 26 U/L (ref 0–44)
AST: 22 U/L (ref 15–41)
Albumin: 3.5 g/dL (ref 3.5–5.0)
Alkaline Phosphatase: 31 U/L — ABNORMAL LOW (ref 38–126)
Anion gap: 14 (ref 5–15)
BUN: 9 mg/dL (ref 6–20)
CO2: 21 mmol/L — ABNORMAL LOW (ref 22–32)
Calcium: 9.5 mg/dL (ref 8.9–10.3)
Chloride: 98 mmol/L (ref 98–111)
Creatinine, Ser: 0.63 mg/dL (ref 0.44–1.00)
GFR, Estimated: >60 mL/min (ref >60)
Glucose, Bld: 114 mg/dL — ABNORMAL HIGH (ref 70–99)
Potassium: 3.3 mmol/L — ABNORMAL LOW (ref 3.5–5.1)
Sodium: 133 mmol/L — ABNORMAL LOW (ref 135–145)
Total Bilirubin: 0.7 mg/dL (ref 0.3–1.2)
Total Protein: 6.8 g/dL (ref 6.5–8.1)

## 2020-06-12 LAB — CBC
HCT: 32.9 % — ABNORMAL LOW (ref 36.0–46.0)
Hemoglobin: 12.1 g/dL (ref 12.0–15.0)
MCH: 32.8 pg (ref 26.0–34.0)
MCHC: 36.8 g/dL — ABNORMAL HIGH (ref 30.0–36.0)
MCV: 89.2 fL (ref 80.0–100.0)
Platelets: 331 10*3/uL (ref 150–400)
RBC: 3.69 MIL/uL — ABNORMAL LOW (ref 3.87–5.11)
RDW: 11.8 % (ref 11.5–15.5)
WBC: 9.1 10*3/uL (ref 4.0–10.5)
nRBC: 0 % (ref 0.0–0.2)

## 2020-06-12 MED ORDER — PROMETHAZINE HCL 25 MG/ML IJ SOLN
12.5000 mg | INTRAMUSCULAR | Status: AC
  Administered 2020-06-12: 12.5 mg via INTRAMUSCULAR
  Filled 2020-06-12: qty 1

## 2020-06-12 MED ORDER — LIDOCAINE VISCOUS HCL 2 % MT SOLN
15.0000 mL | Freq: Once | OROMUCOSAL | Status: AC
  Administered 2020-06-12: 15 mL via ORAL
  Filled 2020-06-12: qty 15

## 2020-06-12 MED ORDER — ALUM & MAG HYDROXIDE-SIMETH 200-200-20 MG/5ML PO SUSP
30.0000 mL | Freq: Once | ORAL | Status: AC
  Administered 2020-06-12: 30 mL via ORAL
  Filled 2020-06-12: qty 30

## 2020-06-12 MED ORDER — LACTATED RINGERS IV BOLUS
1000.0000 mL | Freq: Once | INTRAVENOUS | Status: AC
  Administered 2020-06-12: 1000 mL via INTRAVENOUS

## 2020-06-12 MED ORDER — PROMETHAZINE HCL 25 MG PO TABS: 25.0000 mg | ORAL_TABLET | Freq: Four times a day (QID) | ORAL | 1 refills | Status: DC | PRN

## 2020-06-12 MED ORDER — ONDANSETRON 4 MG PO TBDP
8.0000 mg | ORAL_TABLET | Freq: Once | ORAL | Status: AC
  Administered 2020-06-12: 8 mg via ORAL
  Filled 2020-06-12: qty 2

## 2020-06-12 MED ORDER — ONDANSETRON 8 MG PO TBDP: 8.0000 mg | ORAL_TABLET | Freq: Three times a day (TID) | ORAL | 1 refills | Status: DC | PRN

## 2020-06-12 NOTE — MAU Note (Signed)
Pt reporting nausea/vomiting and abdominal for 2 weeks.  She denies VB. Has white/yellow discharge. Was seen at [facility] in December.

## 2020-06-12 NOTE — MAU Provider Note (Signed)
Patient Deanna Cooper is a 20 y.o.  G2P1001  at [redacted]w[redacted]d here with complaints nausea, vomiting and back pain and abdominal pain at the top of her stomach. She denies vaginal bleeding. She denies contractions. She denies abnormal discharge, itching, fever, SOB.   She was seen in [facility]; was treated for morning sickness and sent home with Reglan and Potassium. She has run out of the Reglan, but still some potassium. She has been missing her doses because her partner is not there to make sure that she takes them.   She was negative for COVID-19 on 12/22; she says that her symptoms have not changed since then.   History     CSN: [id number]  Arrival date and time: 06/12/20 1228   None     Chief Complaint  Patient presents with  . Nausea  . Emesis  . Abdominal Pain   Emesis  This is a new problem. The current episode started 1 to 4 weeks ago. The problem occurs more than 10 times per day. The problem has been unchanged. The emesis has an appearance of bile. There has been no fever. Pertinent negatives include no abdominal pain, chest pain, diarrhea, dizziness or fever.    OB History    Gravida  2   Para  1   Term  1   Preterm      AB      Living  1     SAB      IAB      Ectopic      Multiple  0   Live Births  1           Past Medical History:  Diagnosis Date  . Hyperemesis affecting pregnancy, antepartum   . Learning disability    Dx age 30 yo    Past Surgical History:  Procedure Laterality Date  . CESAREAN SECTION N/A 11/05/2017   Procedure: CESAREAN SECTION;  Surgeon: Reva Bores, MD;  Location: [facility] BIRTHING SUITES;  Service: Obstetrics;  Laterality: N/A;    Family History  Problem Relation Age of Onset  . Diabetes Mother   . Hypertension Mother   . Hyperlipidemia Mother   . Thalassemia Brother     Social History   Tobacco Use  . Smoking status: Never Smoker  . Smokeless tobacco: Never Used  Substance Use Topics  . Alcohol use: No  . Drug use: No     Allergies: No Known Allergies  Medications Prior to Admission  Medication Sig Dispense Refill Last Dose  . promethazine (PHENERGAN) 25 MG tablet Take 1 tablet (25 mg total) by mouth every 6 (six) hours as needed for nausea or vomiting. 15 tablet 0 Past Week at Unknown time  . acetaminophen (TYLENOL) 500 MG tablet Take 500 mg by mouth every 6 (six) hours as needed for mild pain or headache.     . diphenhydrAMINE (BENADRYL) 25 MG tablet Take 25 mg by mouth at bedtime as needed for sleep.     Marland Kitchen doxylamine, Sleep, (UNISOM) 25 MG tablet Take 1 tablet (25 mg total) by mouth at bedtime as needed. 30 tablet 0   . ferrous sulfate (FERROUSUL) 325 (65 FE) MG tablet Take 1 tablet (325 mg total) by mouth 2 (two) times daily. 60 tablet 1   . potassium chloride (KLOR-CON) 20 MEQ packet Take 40 mEq by mouth 2 (two) times daily for 4 days. 16 packet 0   . Prenatal Vit-Fe Fumarate-FA (PRENATAL MULTIVITAMIN) TABS tablet Take 1 tablet by  mouth daily at 12 noon.       Review of Systems  Constitutional: Negative for fever.  Cardiovascular: Negative for chest pain.  Gastrointestinal: Positive for vomiting. Negative for abdominal pain and diarrhea.  Neurological: Negative for dizziness.   Physical Exam   Blood pressure 115/69, pulse 93, temperature 98.6 F (37 C), temperature source Oral, resp. rate 18, last menstrual period 04/01/2020, SpO2 100 %.  Physical Exam Constitutional:      Appearance: She is well-developed.  Pulmonary:     Effort: Pulmonary effort is normal.  Abdominal:     General: Abdomen is flat.     Palpations: Abdomen is soft.     Tenderness: There is no abdominal tenderness.  Neurological:     Mental Status: She is alert.     MAU Course  Procedures  MDM -will try Zofran ODT. IV fluid bolus and draw labs, will try lidocaine swish  -UA shows 80 of ketones.  -Patient tolerated PO while in MAU, feels better.  -CBC and CMP normal, hypokalemia is resolving.  -Abdomen is soft,  non-tender, no concern for acute abdomen.  Assessment and Plan   1. Morning sickness    2. Patient stable for discharge with recommendation to schedule prenatal visit.  3. Continue to take Zofran, phenergan as needed.  4. All questions answered; patient stable for discharge.   Charlesetta Garibaldi Clothilde Tippetts 06/12/2020, 1:18 PM

## 2020-06-12 NOTE — Discharge Instructions (Signed)

## 2020-06-23 ENCOUNTER — Other Ambulatory Visit: Payer: Self-pay

## 2020-06-23 ENCOUNTER — Ambulatory Visit (INDEPENDENT_AMBULATORY_CARE_PROVIDER_SITE_OTHER): Payer: Medicaid Other | Admitting: *Deleted

## 2020-06-23 ENCOUNTER — Other Ambulatory Visit (HOSPITAL_COMMUNITY)
Admission: RE | Admit: 2020-06-23 | Discharge: 2020-06-23 | Disposition: A | Discharge status: Home / Self Care | Payer: Medicaid Other | Source: Ambulatory Visit | Attending: Obstetrics and Gynecology | Admitting: Obstetrics and Gynecology

## 2020-06-23 VITALS — BP 116/75 | HR 98 | Temp 97.9°F | Wt 110.0 lb

## 2020-06-23 DIAGNOSIS — Z3480 Encounter for supervision of other normal pregnancy, unspecified trimester: Secondary | ICD-10-CM | POA: Diagnosis not present

## 2020-06-23 DIAGNOSIS — N898 Other specified noninflammatory disorders of vagina: Secondary | ICD-10-CM | POA: Insufficient documentation

## 2020-06-23 DIAGNOSIS — O26899 Other specified pregnancy related conditions, unspecified trimester: Secondary | ICD-10-CM | POA: Diagnosis present

## 2020-06-23 MED ORDER — BLOOD PRESSURE MONITOR AUTOMAT DEVI: 1.0000 | Freq: Every day | 0 refills | Status: DC

## 2020-06-23 MED ORDER — GOJJI WEIGHT SCALE MISC: 1.0000 | Freq: Every day | 0 refills | Status: DC | PRN

## 2020-06-23 NOTE — Progress Notes (Signed)
   Location: [facility] Patient: Deanna Cooper Provider: Clovis Pu, RN   PRENATAL INTAKE SUMMARY  Ms. Olthoff presents today New OB Nurse Interview.  OB History    Gravida  2   Para  1   Term  1   Preterm      AB      Living  1     SAB      IAB      Ectopic      Multiple  0   Live Births  1          I have reviewed the patient's medical, obstetrical, social, and family histories, medications, and available lab results.  SUBJECTIVE She complains of vaginal discharge.  OBJECTIVE Initial Nurse interview for history/labs (New OB).  EDD: 01/06/2021 GA: [redacted]w[redacted]d G2P1001 FHT: unable to    GENERAL APPEARANCE: alert, well appearing, in no apparent distress, oriented to person, place and time   ASSESSMENT Normal pregnancy  PLAN Prenatal care:  [facility] OB Pnl/HIV/Hep C OB Urine Culture GC/CT (self-swab) HgbEval/SMA/CF ([facility]) Panorama A1C Ultrasound <14 wk to confirm viability Rx [facility] for BP monitor and weight scale Continue PNV  Follow Up Instructions:   I discussed the assessment and treatment plan with the patient. The patient was provided an opportunity to ask questions and all were answered. The patient agreed with the plan and demonstrated an understanding of the instructions.   The patient was advised to call back or seek an in-person evaluation if the symptoms worsen or if the condition fails to improve as anticipated.  I provided 30 minutes of  face-to-face time during this encounter.  Clovis Pu, RN

## 2020-06-23 NOTE — Patient Instructions (Addendum)
Genetic Screening Results Information: You are having genetic testing called [facility] today.  It will take approximately 2 weeks before the results are available.  To get your results, you need Internet access to a web browser to search [facility]/MyChart (the direct app on your phone will not give you these results).  Then select Lab Scanned and click on the blue hyper link that says View Image to see your Panorama results.  You can also use the directions on the purple card given to look up your results directly on the [facility] website. First Trimester of Pregnancy  The first trimester of pregnancy starts on the first day of your last menstrual period until the end of week 12. This is also called months 1 through 3 of pregnancy. Body changes during your first trimester Your body goes through many changes during pregnancy. The changes usually return to normal after your baby is born. Physical changes  You may gain or lose weight.  Your breasts may grow larger and hurt. The area around your nipples may get darker.  Dark spots or blotches may develop on your face.  You may have changes in your hair. Health changes  You may feel like you might vomit (nauseous), and you may vomit.  You may have heartburn.  You may have headaches.  You may have trouble pooping (constipation).  Your gums may bleed. Other changes  You may get tired easily.  You may pee (urinate) more often.  Your menstrual periods will stop.  You may not feel hungry.  You may want to eat certain kinds of food.  You may have changes in your emotions from day to day.  You may have more dreams. Follow these instructions at home: Medicines  Take over-the-counter and prescription medicines only as told by your doctor. Some medicines are not safe during pregnancy.  Take a prenatal vitamin that contains at least 600 micrograms (mcg) of folic acid. Eating and drinking  Eat healthy meals that include: ? Fresh  fruits and vegetables. ? Whole grains. ? Good sources of protein, such as meat, eggs, or tofu. ? Low-fat dairy products.  Avoid raw meat and unpasteurized juice, milk, and cheese.  If you feel like you may vomit, or you vomit: ? Eat 4 or 5 small meals a day instead of 3 large meals. ? Try eating a few soda crackers. ? Drink liquids between meals instead of during meals.  You may need to take these actions to prevent or treat trouble pooping: ? Drink enough fluids to keep your pee (urine) pale yellow. ? Eat foods that are high in fiber. These include beans, whole grains, and fresh fruits and vegetables. ? Limit foods that are high in fat and sugar. These include fried or sweet foods. Activity  Exercise only as told by your doctor. Most people can do their usual exercise routine during pregnancy.  Stop exercising if you have cramps or pain in your lower belly (abdomen) or low back.  Do not exercise if it is too hot or too humid, or if you are in a place of great height (high altitude).  Avoid heavy lifting.  If you choose to, you may have sex unless your doctor tells you not to. Relieving pain and discomfort  Wear a good support bra if your breasts are sore.  Rest with your legs raised (elevated) if you have leg cramps or low back pain.  If you have bulging veins (varicose veins) in your legs: ? Wear support  hose as told by your doctor. ? Raise your feet for 15 minutes, 3-4 times a day. ? Limit salt in your food. Safety  Wear your seat belt at all times when you are in a car.  Talk with your doctor if someone is hurting you or yelling at you.  Talk with your doctor if you are feeling sad or have thoughts of hurting yourself. Lifestyle  Do not use hot tubs, steam rooms, or saunas.  Do not douche. Do not use tampons or scented sanitary pads.  Do not use herbal medicines, illegal drugs, or medicines that are not approved by your doctor. Do not drink alcohol.  Do not  smoke or use any products that contain nicotine or tobacco. If you need help quitting, ask your doctor.  Avoid cat litter boxes and soil that is used by cats. These carry germs that can cause harm to the baby and can cause a loss of your baby by miscarriage or stillbirth. General instructions  Keep all follow-up visits. This is important.  Ask for help if you need counseling or if you need help with nutrition. Your doctor can give you advice or tell you where to go for help.  Visit your dentist. At home, brush your teeth with a soft toothbrush. Floss gently.  Write down your questions. Take them to your prenatal visits. Where to find more information  American Pregnancy Association: americanpregnancy.org  [employer]ians [employer] Gynec[employer]: www.acog.org  Office on Women's Health: [place]health.gov/pregnancy Contact a doctor if:  You are dizzy.  You have a fever.  You have mild cramps or pressure in your lower belly.  You have a nagging pain in your belly area.  You continue to feel like you may vomit, you vomit, or you have watery poop (diarrhea) for 24 hours or longer.  You have a bad-smelling fluid coming from your vagina.  You have pain when you pee.  You are exposed to a disease that spreads from person to person, such as chickenpox, measles, Zika virus, HIV, or hepatitis. Get help right away if:  You have spotting or bleeding from your vagina.  You have very bad belly cramping or pain.  You have shortness of breath or chest pain.  You have any kind of injury, such as from a fall or a car crash.  You have new or increased pain, swelling, or redness in an arm or leg. Summary  The first trimester of pregnancy starts on the first day of your last menstrual period until the end of week 12 (months 1 through 3).  Eat 4 or 5 small meals a day instead of 3 large meals.  Do not smoke or use any products that contain nicotine or tobacco. If you need help  quitting, ask your doctor.  Keep all follow-up visits. This information is not intended to replace advice given to you by your health care provider. Make sure you discuss any questions you have with your health care provider. Document Revised: 10/30/2019 Document Reviewed: 09/05/2019 Elsevier Patient Education  2021 Elsevier Inc.  Genetic Testing During Pregnancy Why is genetic testing done? Genetic testing during pregnancy is also called prenatal genetic testing. This type of testing can determine if your baby is at risk of being born with a disorder caused by abnormal genes or chromosomes (genetic disorder). Chromosomes contain genes that control how your baby will develop in your womb. There are many different genetic disorders. Examples of genetic disorders that may be found through genetic  testing include Down syndrome and cystic fibrosis. Gene changes (mutations) can be passed down through families. Genetic testing is offered to women during pregnancy. You can choose whether to have genetic testing. Having genetic testing allows you to:  Discuss your test results and options with your health care provider.  Prepare for a baby that may be born with a genetic disorder. Learning about the disorder ahead of time helps you be better prepared to manage it. Your health care providers can also be prepared in case your baby requires special care before or after birth.  Consider whether you want to continue with the pregnancy. In some cases, genetic testing may be done to learn about the traits a child will inherit. Types of genetic tests There are two basic types of genetic testing. Screening tests indicate whether your developing baby (fetus) is at higher risk for a genetic disorder. Diagnostic tests check actual fetal cells to diagnose a genetic disorder. Screening tests Screening tests will not harm your baby. They are recommended for all pregnant women. Types of screening tests  include:  Carrier screening. This test involves checking genes from both parents by testing their blood or saliva. The test checks to find out if the parents carry a genetic mutation that may be passed to a baby. In most cases, both parents must carry the mutation for a baby to be at risk.  First trimester screening. This test combines a blood test with sound wave imaging of your baby (fetal ultrasound). This screening test checks for a risk of Down syndrome or other defects caused by having extra chromosomes. The ultrasound also checks for defects of the heart, abdomen, or skeleton.  Second trimester screening also combines a blood test with a fetal ultrasound exam. This test checks for a risk of Down syndrome or other defects caused by having extra chromosomes. The ultrasound allows your health care provider to look for genetic defects of the face, brain, spine, heart, or limbs. Some women may choose to only have an ultrasound exam without a blood test.  Combined or sequential screening. This type of testing combines the results of first and second trimester screening. This type of testing may be more accurate than first or second trimester screening alone.  Cell-free DNA testing. This is a blood test that detects cells released by the placenta that get into the mother's blood. It can be used to check for a risk of Down syndrome, other extra chromosome syndromes, and disorders caused by abnormal numbers of sex chromosomes. This test can be done any time after 10 weeks of pregnancy.      Diagnostic tests Diagnostic tests carry slight risks of problems, including bleeding, infection, and loss of the pregnancy. These tests are done only if your baby is at risk for a genetic disorder. Your health care provider will discuss the risks and benefits of having diagnostic tests before performing these types of tests. Examples of diagnostic tests include:  Chorionic villus sampling (CVS). This involves a  procedure to remove and test a sample of cells taken from the placenta. The procedure may be done between 10 and 12 weeks of pregnancy.  Amniocentesis. This involves a procedure to remove and test a sample of fluid (amniotic fluid) and cells from the sac that surrounds the developing baby. The procedure may be done any time during the pregnancy, but it is usually done between 15 and 20 weeks of pregnancy. What do the results mean? For a screening test:  If the  results are negative, it often means that your child is not at higher risk. There is still a slight chance your child could have a genetic disorder.  If the results are positive, it does not mean your child will have a genetic disorder. It may mean that your child has a higher-than-normal risk for a genetic disorder. In that case, you should talk with your health care provider about whether you should have diagnostic genetic tests. For a diagnostic test:  If the result is negative, it is unlikely that your child will have a genetic disorder.  If the test is positive for a genetic disorder, it is likely that your child will have the disorder. The test may not tell how severe the disorder will be. Talk with your health care provider about your options. Talk with your health care provider about what your results mean. Questions to ask your health care provider Before talking to your health care provider about genetic testing, find out if there is a history of genetic disorders in your family. It may also help to know your family's ethnic origins. Then ask your health care provider the following questions:  Is my baby at risk for a genetic disorder?  What are the benefits of having genetic screening?  What tests are best for me and my baby?  What are the risks of each test?  If I get a positive result on a screening test, what is the next step?  Should I meet with a genetic counselor?  Should my partner or other members of my family  be tested?  How much do the tests cost? Will my insurance cover the testing? Summary  Genetic testing is done during pregnancy to find out whether your child is at risk for a genetic disorder.  Genetic testing is offered to women during pregnancy. You can choose whether to have genetic testing.  There are two basic types of genetic testing. Screening tests indicate whether your developing baby (fetus) is at higher risk for a genetic disorder. Diagnostic tests check actual fetal cells to diagnose a genetic disorder.  If a diagnostic genetic test is positive, talk with your health care provider about your options. This information is not intended to replace advice given to you by your health care provider. Make sure you discuss any questions you have with your health care provider. Document Revised: 12/13/2019 Document Reviewed: 12/13/2019 Elsevier Patient Education  2021 Elsevier Inc.  How to Take Your Blood Pressure Blood pressure measures how strongly your blood is pressing against the walls of your arteries. Arteries are blood vessels that carry blood from your heart throughout your body. You can take your blood pressure at home with a machine. You may need to check your blood pressure at home:  To check if you have high blood pressure (hypertension).  To check your blood pressure over time.  To make sure your blood pressure medicine is working. Supplies needed:  Blood pressure machine, or monitor.  Dining room chair to sit in.  Table or desk.  Small notebook.  Pencil or pen. How to prepare Avoid these things for 30 minutes before checking your blood pressure:  Having drinks with caffeine in them, such as coffee or tea.  Drinking alcohol.  Eating.  Smoking.  Exercising. Do these things five minutes before checking your blood pressure:  Go to the bathroom and pee (urinate).  Sit in a dining chair. Do not sit in a soft couch or an armchair.  Be quiet.  Do not  talk. How to take your blood pressure Follow the instructions that came with your machine. If you have a digital blood pressure monitor, these may be the instructions: 1. Sit up straight. 2. Place your feet on the floor. Do not cross your ankles or legs. 3. Rest your left arm at the level of your heart. You may rest it on a table, desk, or chair. 4. Pull up your shirt sleeve. 5. Wrap the blood pressure cuff around the upper part of your left arm. The cuff should be 1 inch (2.5 cm) above your elbow. It is best to wrap the cuff around bare skin. 6. Fit the cuff snugly around your arm. You should be able to place only one finger between the cuff and your arm. 7. Place the cord so that it rests in the bend of your elbow. 8. Press the power button. 9. Sit quietly while the cuff fills with air and loses air. 10. Write down the numbers on the screen. 11. Wait 2-3 minutes and then repeat steps 1-10.   What do the numbers mean? Two numbers make up your blood pressure. The first number is called systolic pressure. The second is called diastolic pressure. An example of a blood pressure reading is "120 over 80" (or 120/80). If you are an adult and do not have a medical condition, use this guide to find out if your blood pressure is normal: Normal  First number: below 120.  Second number: below 80. Elevated  First number: 120-129.  Second number: below 80. Hypertension stage 1  First number: 130-139.  Second number: 80-89. Hypertension stage 2  First number: 140 or above.  Second number: 90 or above. Your blood pressure is above normal even if only the top or bottom number is above normal. Follow these instructions at home:  Check your blood pressure as often as your doctor tells you to.  Check your blood pressure at the same time every day.  Take your monitor to your next doctor's appointment. Your doctor will: ? Make sure you are using it correctly. ? Make sure it is working  right.  Make sure you understand what your blood pressure numbers should be.  Tell your doctor if your medicine is causing side effects.  Keep all follow-up visits as told by your doctor. This is important. General tips:  You will need a blood pressure machine, or monitor. Your doctor can suggest a monitor. You can buy one at a drugstore or online. When choosing one: ? Choose one with an arm cuff. ? Choose one that wraps around your upper arm. Only one finger should fit between your arm and the cuff. ? Do not choose one that measures your blood pressure from your wrist or finger. Where to find more information American Heart Association: www.heart.org Contact a doctor if:  Your blood pressure keeps being high. Get help right away if:  Your first blood pressure number is higher than 180.  Your second blood pressure number is higher than 120. Summary  Check your blood pressure at the same time every day.  Avoid caffeine, alcohol, smoking, and exercise for 30 minutes before checking your blood pressure.  Make sure you understand what your blood pressure numbers should be. This information is not intended to replace advice given to you by your health care provider. Make sure you discuss any questions you have with your health care provider. Document Revised: 05/17/2019 Document Reviewed: 05/17/2019 Elsevier Patient Education  2021 Elsevier  Inc.  

## 2020-06-24 LAB — CBC/D/PLT+RPR+RH+ABO+RUB AB...
Antibody Screen: NEGATIVE
Basophils Absolute: 0 10*3/uL (ref 0.0–0.2)
Basos: 0 %
EOS (ABSOLUTE): 0.1 10*3/uL (ref 0.0–0.4)
Eos: 1 %
HCV Ab: <0.1 s/co ratio (ref 0.0–0.9)
HIV Screen 4th Generation wRfx: NONREACTIVE
Hematocrit: 33 % — ABNORMAL LOW (ref 34.0–46.6)
Hemoglobin: 11.6 g/dL (ref 11.1–15.9)
Hepatitis B Surface Ag: NEGATIVE
Immature Grans (Abs): 0 10*3/uL (ref 0.0–0.1)
Immature Granulocytes: 0 %
Lymphocytes Absolute: 2 10*3/uL (ref 0.7–3.1)
Lymphs: 20 %
MCH: 32.3 pg (ref 26.6–33.0)
MCHC: 35.2 g/dL (ref 31.5–35.7)
MCV: 92 fL (ref 79–97)
Monocytes Absolute: 0.7 10*3/uL (ref 0.1–0.9)
Monocytes: 7 %
Neutrophils Absolute: 7 10*3/uL (ref 1.4–7.0)
Neutrophils: 72 %
Platelets: 369 10*3/uL (ref 150–450)
RBC: 3.59 x10E6/uL — ABNORMAL LOW (ref 3.77–5.28)
RDW: 11.5 % — ABNORMAL LOW (ref 11.7–15.4)
RPR Ser Ql: NONREACTIVE
Rh Factor: POSITIVE
Rubella Antibodies, IGG: 5.82 index (ref >0.99)
WBC: 9.8 10*3/uL (ref 3.4–10.8)

## 2020-06-24 LAB — CERVICOVAGINAL ANCILLARY ONLY
Bacterial Vaginitis (gardnerella): POSITIVE — AB
Candida Glabrata: NEGATIVE
Candida Vaginitis: POSITIVE — AB
Chlamydia: POSITIVE — AB
Comment: NEGATIVE
Comment: NEGATIVE
Comment: NEGATIVE
Comment: NEGATIVE
Comment: NEGATIVE
Comment: NORMAL
Neisseria Gonorrhea: NEGATIVE
Trichomonas: NEGATIVE

## 2020-06-24 LAB — HCV INTERPRETATION

## 2020-06-24 LAB — HEMOGLOBIN A1C
Est. average glucose Bld gHb Est-mCnc: 97 mg/dL
Hgb A1c MFr Bld: 5 % (ref 4.8–5.6)

## 2020-06-25 ENCOUNTER — Other Ambulatory Visit: Payer: Self-pay

## 2020-06-25 DIAGNOSIS — A749 Chlamydial infection, unspecified: Secondary | ICD-10-CM

## 2020-06-25 LAB — URINE CULTURE, OB REFLEX

## 2020-06-25 LAB — CULTURE, OB URINE

## 2020-06-25 MED ORDER — TERCONAZOLE 0.4 % VA CREA: 1.0000 | TOPICAL_CREAM | Freq: Every day | VAGINAL | 0 refills | Status: DC

## 2020-06-25 MED ORDER — AZITHROMYCIN 500 MG PO TABS: 1000.0000 mg | ORAL_TABLET | Freq: Once | ORAL | 0 refills | Status: AC

## 2020-06-26 ENCOUNTER — Telehealth: Payer: Self-pay | Admitting: *Deleted

## 2020-06-26 NOTE — Telephone Encounter (Signed)
Patient returned nurse call regarding test results. Patient verified DOB. Patient informed of positive Chlamydia, BV and yeast. Medications were sent to pharmacy on file. Advised no sex for 10-14 days and partner will need treatment/testing. STD report faxed to [facility] STD dept. Patient will need re-screening in 3-4 weeks.  Clovis Pu, RN

## 2020-06-26 NOTE — Telephone Encounter (Signed)
-----   Message from Gerrit Heck, [place] sent at 06/25/2020  4:12 PM EST ----- Please call patient and inform of CT results.  Rx for Zithromax and Terazol to pharmacy on file. Thanks, JE

## 2020-06-26 NOTE — Telephone Encounter (Signed)
Left voice message for patient to return nurse call regarding lab results. STD report sent faxed to [facility].  Clovis Pu, RN

## 2020-06-29 ENCOUNTER — Encounter: Payer: Self-pay | Admitting: *Deleted

## 2020-07-02 ENCOUNTER — Other Ambulatory Visit: Payer: Self-pay

## 2020-07-02 ENCOUNTER — Ambulatory Visit
Admission: RE | Admit: 2020-07-02 | Discharge: 2020-07-02 | Disposition: A | Discharge status: Home / Self Care | Payer: Medicaid Other | Source: Ambulatory Visit

## 2020-07-02 ENCOUNTER — Telehealth: Payer: Self-pay | Admitting: Medical

## 2020-07-02 DIAGNOSIS — Z3A13 13 weeks gestation of pregnancy: Secondary | ICD-10-CM | POA: Diagnosis not present

## 2020-07-02 DIAGNOSIS — O3680X0 Pregnancy with inconclusive fetal viability, not applicable or unspecified: Secondary | ICD-10-CM | POA: Diagnosis not present

## 2020-07-02 DIAGNOSIS — Z3480 Encounter for supervision of other normal pregnancy, unspecified trimester: Secondary | ICD-10-CM | POA: Insufficient documentation

## 2020-07-02 NOTE — Telephone Encounter (Signed)
Attempted to contact [name] by phone to discuss [place] results from earlier today. LM to return call to my office.   Vonzella Nipple, PA-C 07/02/2020 10:12 AM

## 2020-07-03 ENCOUNTER — Encounter (HOSPITAL_COMMUNITY): Payer: Self-pay | Admitting: Obstetrics & Gynecology

## 2020-07-03 ENCOUNTER — Ambulatory Visit (INDEPENDENT_AMBULATORY_CARE_PROVIDER_SITE_OTHER): Payer: Medicaid Other

## 2020-07-03 ENCOUNTER — Other Ambulatory Visit: Payer: Self-pay

## 2020-07-03 ENCOUNTER — Inpatient Hospital Stay (HOSPITAL_COMMUNITY)
Admission: AD | Admit: 2020-07-03 | Discharge: 2020-07-03 | Disposition: A | Discharge status: Home / Self Care | Payer: Medicaid Other | Attending: Obstetrics & Gynecology | Admitting: Obstetrics & Gynecology

## 2020-07-03 VITALS — BP 137/77 | HR 103 | Temp 98.1°F

## 2020-07-03 DIAGNOSIS — O210 Mild hyperemesis gravidarum: Secondary | ICD-10-CM | POA: Diagnosis not present

## 2020-07-03 DIAGNOSIS — F1290 Cannabis use, unspecified, uncomplicated: Secondary | ICD-10-CM

## 2020-07-03 DIAGNOSIS — Z3480 Encounter for supervision of other normal pregnancy, unspecified trimester: Secondary | ICD-10-CM

## 2020-07-03 DIAGNOSIS — Z79899 Other long term (current) drug therapy: Secondary | ICD-10-CM | POA: Diagnosis not present

## 2020-07-03 DIAGNOSIS — Z3A13 13 weeks gestation of pregnancy: Secondary | ICD-10-CM

## 2020-07-03 DIAGNOSIS — Z8249 Family history of ischemic heart disease and other diseases of the circulatory system: Secondary | ICD-10-CM | POA: Diagnosis not present

## 2020-07-03 DIAGNOSIS — O219 Vomiting of pregnancy, unspecified: Secondary | ICD-10-CM

## 2020-07-03 DIAGNOSIS — O99321 Drug use complicating pregnancy, first trimester: Secondary | ICD-10-CM | POA: Diagnosis not present

## 2020-07-03 DIAGNOSIS — O218 Other vomiting complicating pregnancy: Secondary | ICD-10-CM | POA: Diagnosis present

## 2020-07-03 DIAGNOSIS — F12188 Cannabis abuse with other cannabis-induced disorder: Secondary | ICD-10-CM

## 2020-07-03 DIAGNOSIS — O10911 Unspecified pre-existing hypertension complicating pregnancy, first trimester: Secondary | ICD-10-CM | POA: Diagnosis not present

## 2020-07-03 LAB — COMPREHENSIVE METABOLIC PANEL
ALT: 14 U/L (ref 0–44)
AST: 22 U/L (ref 15–41)
Albumin: 3.8 g/dL (ref 3.5–5.0)
Alkaline Phosphatase: 51 U/L (ref 38–126)
Anion gap: 13 (ref 5–15)
BUN: <5 mg/dL — ABNORMAL LOW (ref 6–20)
CO2: 23 mmol/L (ref 22–32)
Calcium: 10 mg/dL (ref 8.9–10.3)
Chloride: 98 mmol/L (ref 98–111)
Creatinine, Ser: 0.66 mg/dL (ref 0.44–1.00)
GFR, Estimated: >60 mL/min (ref >60)
Glucose, Bld: 99 mg/dL (ref 70–99)
Potassium: 3.6 mmol/L (ref 3.5–5.1)
Sodium: 134 mmol/L — ABNORMAL LOW (ref 135–145)
Total Bilirubin: 0.5 mg/dL (ref 0.3–1.2)
Total Protein: 7.7 g/dL (ref 6.5–8.1)

## 2020-07-03 LAB — URINALYSIS, ROUTINE W REFLEX MICROSCOPIC
Bacteria, UA: NONE SEEN
Bilirubin Urine: NEGATIVE
Glucose, UA: NEGATIVE mg/dL
Hgb urine dipstick: NEGATIVE
Ketones, ur: 80 mg/dL — AB
Leukocytes,Ua: NEGATIVE
Nitrite: NEGATIVE
Protein, ur: 100 mg/dL — AB
Specific Gravity, Urine: 1.025 (ref 1.005–1.030)
pH: 8 (ref 5.0–8.0)

## 2020-07-03 LAB — CBC
HCT: 35.1 % — ABNORMAL LOW (ref 36.0–46.0)
Hemoglobin: 12.4 g/dL (ref 12.0–15.0)
MCH: 32.5 pg (ref 26.0–34.0)
MCHC: 35.3 g/dL (ref 30.0–36.0)
MCV: 91.9 fL (ref 80.0–100.0)
Platelets: 425 10*3/uL — ABNORMAL HIGH (ref 150–400)
RBC: 3.82 MIL/uL — ABNORMAL LOW (ref 3.87–5.11)
RDW: 12.3 % (ref 11.5–15.5)
WBC: 12.2 10*3/uL — ABNORMAL HIGH (ref 4.0–10.5)
nRBC: 0 % (ref 0.0–0.2)

## 2020-07-03 LAB — RAPID URINE DRUG SCREEN, HOSP PERFORMED
Amphetamines: NOT DETECTED
Barbiturates: NOT DETECTED
Benzodiazepines: NOT DETECTED
Cocaine: NOT DETECTED
Opiates: NOT DETECTED
Tetrahydrocannabinol: POSITIVE — AB

## 2020-07-03 MED ORDER — SODIUM CHLORIDE 0.9 % IV SOLN
8.0000 mg | Freq: Once | INTRAVENOUS | Status: AC
  Administered 2020-07-03: 8 mg via INTRAVENOUS
  Filled 2020-07-03: qty 4

## 2020-07-03 MED ORDER — HALOPERIDOL LACTATE 5 MG/ML IJ SOLN
5.0000 mg | Freq: Four times a day (QID) | INTRAMUSCULAR | Status: DC | PRN
  Administered 2020-07-03: 5 mg via INTRAVENOUS
  Filled 2020-07-03 (×2): qty 1

## 2020-07-03 MED ORDER — PROMETHAZINE HCL 25 MG/ML IJ SOLN
25.0000 mg | Freq: Once | INTRAVENOUS | Status: AC
  Administered 2020-07-03: 25 mg via INTRAVENOUS
  Filled 2020-07-03: qty 1

## 2020-07-03 MED ORDER — RANITIDINE HCL 150 MG PO CAPS: 150.0000 mg | ORAL_CAPSULE | Freq: Every day | ORAL | 0 refills | Status: DC

## 2020-07-03 MED ORDER — PROMETHAZINE HCL 25 MG PO TABS: 25.0000 mg | ORAL_TABLET | Freq: Four times a day (QID) | ORAL | 2 refills | Status: DC | PRN

## 2020-07-03 MED ORDER — SCOPOLAMINE 1 MG/3DAYS TD PT72
1.0000 | MEDICATED_PATCH | TRANSDERMAL | Status: DC
  Administered 2020-07-03: 1.5 mg via TRANSDERMAL
  Filled 2020-07-03: qty 1

## 2020-07-03 MED ORDER — ONDANSETRON HCL 4 MG/2ML IJ SOLN: 4.0000 mg | Freq: Once | INTRAMUSCULAR | Status: DC

## 2020-07-03 MED ORDER — SCOPOLAMINE 1 MG/3DAYS TD PT72: 1.0000 | MEDICATED_PATCH | TRANSDERMAL | 12 refills | Status: DC

## 2020-07-03 MED ORDER — DIPHENHYDRAMINE HCL 50 MG/ML IJ SOLN
25.0000 mg | Freq: Once | INTRAMUSCULAR | Status: AC
  Administered 2020-07-03: 25 mg via INTRAVENOUS
  Filled 2020-07-03: qty 1

## 2020-07-03 MED ORDER — PROMETHAZINE HCL 25 MG RE SUPP: 25.0000 mg | Freq: Four times a day (QID) | RECTAL | 0 refills | Status: DC | PRN

## 2020-07-03 MED ORDER — M.V.I. ADULT IV INJ
Freq: Once | INTRAVENOUS | Status: AC
  Filled 2020-07-03 (×3): qty 1000

## 2020-07-03 MED ORDER — FAMOTIDINE IN NACL 20-0.9 MG/50ML-% IV SOLN
20.0000 mg | Freq: Once | INTRAVENOUS | Status: AC
  Administered 2020-07-03: 20 mg via INTRAVENOUS
  Filled 2020-07-03: qty 50

## 2020-07-03 MED ORDER — LACTATED RINGERS IV SOLN: Freq: Once | INTRAVENOUS | Status: AC

## 2020-07-03 NOTE — Discharge Instructions (Signed)
Cannabinoid Hyperemesis Syndrome Cannabinoid hyperemesis syndrome (CHS) is a condition that causes repeated nausea, vomiting, and abdominal pain after long-term (chronic) use of marijuana (cannabis). People with CHS typically use marijuana 3-5 times a day for many years before they have symptoms, although it is possible to develop CHS with far less daily use. Symptoms of CHS may be mild at first but can get worse and more frequent. In some cases, CHS may cause severe daily vomiting, which can lead to weight loss and dehydration. What are the causes? The exact cause of this condition is not known. Long-term use of marijuana may over-stimulate certain proteins in the brain and gastrointestinal tract that react with chemicals in marijuana (cannabinoid receptors). This over-stimulation may cause CHS. What are the signs or symptoms? Symptoms of this condition are often mild during the first few episodes, but they can get worse over time. Symptoms may include:  Frequent nausea, especially early in the morning.  Vomiting. This can become severe.  Abdominal pain.  Feeling very tired (lethargic).  Headaches. CHS may go away and come back many times (recur). People may not have symptoms or may otherwise be healthy in between CHS episodes. Taking hot showers can relieve the symptoms of CHS, so feeling the need to take several hot showers throughout the day can be a sign of this condition. How is this diagnosed? This condition may be diagnosed based on:  Your symptoms and medical history, including any drug use.  A physical exam. You may have tests done to rule out other problems that could cause your symptoms. These tests may include:  Blood tests.  Urine tests.  Imaging tests, such as an X-ray or a CT scan. How is this treated? Treatment for this condition involves stopping marijuana use. Treatment may include:  A drug rehabilitation program, if you have trouble stopping marijuana  use.  Medicines for nausea. These may be given at the hospital through an IV inserted into one of your veins, or they may be medicines that you take by mouth (orally).  Certain creams that contain a substance called capsaicin. These may improve symptoms when applied to the abdomen.  Hot showers to help relieve symptoms. In severe cases, you may need treatment at a hospital. You may be given IV fluids to prevent or treat dehydration as well as medicines to treat nausea, vomiting, and pain. Follow these instructions at home: During an episode of CHS  Stay in bed and rest in a dark, quiet room.  Take anti-nausea medicine as told by your health care provider.  Try taking hot showers to relieve your symptoms.   After an episode of CHS  Drink small amounts of clear fluids slowly. Gradually add more if you can keep the fluids down without vomiting.  Once you are able to eat without vomiting, eat soft foods in small amounts every 3-4 hours. General instructions  Do not use any products that contain marijuana.If you need help quitting, ask your health care provider for resources and treatment options.  Drink enough fluid to keep your urine pale yellow. Avoid drinking fluids that have a lot of sugar or caffeine, such as coffee and soda.  Take and apply over-the-counter and prescription medicines only as told by your health care provider. Ask your health care provider before starting any new medicines or treatments.  Keep all follow-up visits as told by your health care provider. This is important. This includes any recommended substance abuse programs.   Contact a health care provider   if:  Your symptoms get worse.  You cannot drink fluids without vomiting or severe pain.  You have pain and trouble swallowing after an episode. Get help right away if you:  Cannot stop vomiting.  Have blood in your vomit or your vomit looks like coffee grounds.  Have severe abdominal pain.  Have  stools that are bloody or black, or stools that look like tar.  Have symptoms of dehydration, such as: ? Sunken eyes. ? Inability to make tears. ? Cracked lips. ? Dry mouth. ? Decreased urine production. ? Weakness. ? Sleepiness. ? Dizziness, light-headedness, or fainting. Summary  Cannabinoid hyperemesis syndrome (CHS) is a condition that causes repeated nausea, vomiting, and abdominal pain after long-term use of marijuana.  People with CHS typically use marijuana 3-5 times a day for many years before they have symptoms, although it is possible to develop CHS with much less daily use.  Treatment for this condition involves stopping marijuana use. Hot showers and capsaicin creams may also help relieve symptoms. Ask your health care provider before starting any medicines or other treatments.  Your health care provider may prescribe medicines to help with nausea.  Get help right away if you have signs of dehydration, such as dry mouth, decreased urine production, weakness, dizziness, and light-headedness. This information is not intended to replace advice given to you by your health care provider. Make sure you discuss any questions you have with your health care provider. Document Revised: 03/20/2019 Document Reviewed: 03/20/2019 Elsevier Patient Education  2021 Elsevier Inc.  

## 2020-07-03 NOTE — MAU Provider Note (Signed)
History     CSN: [id number]  Arrival date and time: 07/03/20 0931   Event Date/Time   First Provider Initiated Contact with Patient 07/03/20 1330      Chief Complaint  Patient presents with  . Emesis  . Nausea  . BP Evaluation   HPI Deanna Cooper is a 20 y.o. G2P1001 at [redacted]w[redacted]d who presents to MAU from [facility] with chief complaint of nausea and vomiting. This is recurrent problem for which patient has been evaluated in the traditional ED and MAU on 05/27/2020 and 06/12/2020. Patient endorses taking her previously prescribed Phenergan last night and Zofran this morning. She has been unable to tolerate NPO since breakfast yesterday, when she has eggs and toast. She states she "vomited constantly" during her previous pregnancy "the entire time".  She denies lower abdominal pain, vaginal bleeding, dysuria, fever. She reports THC use two days ago.   OB History    Gravida  2   Para  1   Term  1   Preterm      AB      Living  1     SAB      IAB      Ectopic      Multiple  0   Live Births  1           Past Medical History:  Diagnosis Date  . Chlamydia infection affecting pregnancy in first trimester, antepartum 05/19/2017   Dx 04/26/17 > treated TOC 06/02/17 > negative  . Hyperemesis affecting pregnancy, antepartum   . Learning disability    Dx age 58 yo  . Nexplanon insertion 11/07/2017  . Positive GBS test 10/24/2017  . Supervision of normal first pregnancy, antepartum 04/21/2017    Clinic  [facility] Prenatal Labs Dating  11 wk ultrasound Blood type: AB/Positive/-- (11/16 1442)  Genetic Screen 1 Screen:    AFP:     Quad:     NIPS:obtained 06/02/17 > negative (female) Antibody:Negative (11/16 1442) Anatomic [place]  Normal Rubella: 8.71 (11/16 1442) GTT Early:               Third trimester: 69-113-89 RPR: Non Reactive (03/22 1542)  Flu vaccine  07/14/17 HBsAg: Negative (11/16 144  . Supervision of normal first teen pregnancy, unspecified trimester  04/21/2017   Refer to [facility] > enrolled, class every Thursday; Volunteer Doula    Past Surgical History:  Procedure Laterality Date  . CESAREAN SECTION N/A 11/05/2017   Procedure: CESAREAN SECTION;  Surgeon: Reva Bores, MD;  Location: [facility] BIRTHING SUITES;  Service: Obstetrics;  Laterality: N/A;    Family History  Problem Relation Age of Onset  . Diabetes Mother   . Hypertension Mother   . Hyperlipidemia Mother   . Thalassemia Brother     Social History   Tobacco Use  . Smoking status: Never Smoker  . Smokeless tobacco: Never Used  Vaping Use  . Vaping Use: Never used  Substance Use Topics  . Alcohol use: No  . Drug use: No    Allergies: No Known Allergies  Medications Prior to Admission  Medication Sig Dispense Refill Last Dose  . acetaminophen (TYLENOL) 500 MG tablet Take 500 mg by mouth every 6 (six) hours as needed for mild pain or headache.   07/02/2020 at Unknown time  . ondansetron (ZOFRAN ODT) 8 MG disintegrating tablet Take 1 tablet (8 mg total) by mouth every 8 (eight) hours as needed for nausea or vomiting. 30 tablet 1 07/03/2020  at Unknown time  . Prenatal Vit-Fe Fumarate-FA (PRENATAL MULTIVITAMIN) TABS tablet Take 1 tablet by mouth daily at 12 noon.   Past Week at Unknown time  . promethazine (PHENERGAN) 25 MG tablet Take 1 tablet (25 mg total) by mouth every 6 (six) hours as needed for nausea or vomiting. 30 tablet 1 07/02/2020 at Unknown time  . Blood Pressure Monitoring (BLOOD PRESSURE MONITOR AUTOMAT) DEVI 1 Device by Does not apply route daily. Automatic blood pressure cuff regular size. To monitor blood pressure regularly at home. ICD-10 code:Z34.90 1 each 0   . diphenhydrAMINE (BENADRYL) 25 MG tablet Take 25 mg by mouth at bedtime as needed for sleep.     Marland Kitchen doxylamine, Sleep, (UNISOM) 25 MG tablet Take 1 tablet (25 mg total) by mouth at bedtime as needed. 30 tablet 0   . ferrous sulfate (FERROUSUL) 325 (65 FE) MG tablet Take 1 tablet (325 mg total)  by mouth 2 (two) times daily. 60 tablet 1   . Misc. Devices (GOJJI WEIGHT SCALE) MISC 1 Device by Does not apply route daily as needed. To weight self daily as needed at home. ICD-10 code: Z34.90 1 each 0   . potassium chloride (KLOR-CON) 20 MEQ packet Take 40 mEq by mouth 2 (two) times daily for 4 days. 16 packet 0   . terconazole (TERAZOL 7) 0.4 % vaginal cream Place 1 applicator vaginally at bedtime. Use for seven days 45 g 0     Review of Systems  Constitutional: Positive for fatigue.  Gastrointestinal: Positive for nausea and vomiting. Negative for abdominal pain.  Genitourinary: Negative for vaginal bleeding.  All other systems reviewed and are negative.  Physical Exam   Blood pressure 137/68, pulse (!) 105, temperature 98.7 F (37.1 C), temperature source Oral, resp. rate 15, height 5\' 1"  (1.549 m), weight 47.1 kg, last menstrual period 04/01/2020, SpO2 100 %.  Physical Exam Vitals and nursing note reviewed. Exam conducted with a chaperone present.  Constitutional:      Appearance: Normal appearance.  HENT:     Mouth/Throat:     Mouth: Mucous membranes are moist.  Cardiovascular:     Rate and Rhythm: Normal rate.     Pulses: Normal pulses.     Heart sounds: Normal heart sounds.  Pulmonary:     Effort: Pulmonary effort is normal.     Breath sounds: Normal breath sounds.  Abdominal:     General: Abdomen is flat.     Tenderness: There is no abdominal tenderness. There is no right CVA tenderness or left CVA tenderness.  Skin:    General: Skin is dry.     Capillary Refill: Capillary refill takes less than 2 seconds.  Neurological:     Mental Status: She is alert and oriented to person, place, and time.  Psychiatric:        Mood and Affect: Mood normal.        Behavior: Behavior normal.        Thought Content: Thought content normal.        Judgment: Judgment normal.     MAU Course  Procedures  --Report received from [facility] by R. 04/03/2020, CNM prior to patient  arrival. Orders placed.  --Patient A&O x 4 on initial assessment.   --CNM returned to bedside at 1500. Patient in hands and knees on bed, gagging, small amount of emesis. Endorsing upper abdominal discomfort. New report of THC use, consents to Haldol + Benadryl for Cannabis Hyperemesis. Discussed THC as trigger for cyclical  vomiting in pregnancy. Abstinence advised due to strong likelihood of ongoing cyclical vomiting with continued use  --Previous weight on chart is stated weight. No actual previous weight this pregnancy for comparison  --Patient offered Phenergan suppositories to increase use. Pt consented, then stated she needed ordered canceled because her boyfriend said out of pocket cost was too high  Patient Vitals for the past 24 hrs:  BP Temp Temp src Pulse Resp SpO2 Height Weight  07/03/20 1450 116/67 -- -- 90 15 100 % -- --  07/03/20 1147 137/68 98.7 F (37.1 C) Oral (!) 105 15 100 % -- --  07/03/20 1032 (!) 149/46 -- -- (!) 108 -- -- -- --  07/03/20 1000 -- -- -- -- -- -- 5\' 1"  (1.549 m) 47.1 kg   Results for orders placed or performed during the hospital encounter of 07/03/20 (from the past 24 hour(s))  CBC     Status: Abnormal   Collection Time: 07/03/20 10:10 AM  Result Value Ref Range   WBC 12.2 (H) 4.0 - 10.5 K/uL   RBC 3.82 (L) 3.87 - 5.11 MIL/uL   Hemoglobin 12.4 12.0 - 15.0 g/dL   HCT 07/05/20 (L) 16.1 - 09.6 %   MCV 91.9 80.0 - 100.0 fL   MCH 32.5 26.0 - 34.0 pg   MCHC 35.3 30.0 - 36.0 g/dL   RDW 04.5 40.9 - 81.1 %   Platelets 425 (H) 150 - 400 K/uL   nRBC 0.0 0.0 - 0.2 %  Comprehensive metabolic panel     Status: Abnormal   Collection Time: 07/03/20 10:10 AM  Result Value Ref Range   Sodium 134 (L) 135 - 145 mmol/L   Potassium 3.6 3.5 - 5.1 mmol/L   Chloride 98 98 - 111 mmol/L   CO2 23 22 - 32 mmol/L   Glucose, Bld 99 70 - 99 mg/dL   BUN <5 (L) 6 - 20 mg/dL   Creatinine, Ser 07/05/20 0.44 - 1.00 mg/dL   Calcium 7.82 8.9 - 95.6 mg/dL   Total Protein 7.7 6.5 -  8.1 g/dL   Albumin 3.8 3.5 - 5.0 g/dL   AST 22 15 - 41 U/L   ALT 14 0 - 44 U/L   Alkaline Phosphatase 51 38 - 126 U/L   Total Bilirubin 0.5 0.3 - 1.2 mg/dL   GFR, Estimated 21.3 >08 mL/min   Anion gap 13 5 - 15  Urinalysis, Routine w reflex microscopic Urine, Clean Catch     Status: Abnormal   Collection Time: 07/03/20 10:18 AM  Result Value Ref Range   Color, Urine YELLOW YELLOW   APPearance HAZY (A) CLEAR   Specific Gravity, Urine 1.025 1.005 - 1.030   pH 8.0 5.0 - 8.0   Glucose, UA NEGATIVE NEGATIVE mg/dL   Hgb urine dipstick NEGATIVE NEGATIVE   Bilirubin Urine NEGATIVE NEGATIVE   Ketones, ur 80 (A) NEGATIVE mg/dL   Protein, ur 07/05/20 (A) NEGATIVE mg/dL   Nitrite NEGATIVE NEGATIVE   Leukocytes,Ua NEGATIVE NEGATIVE   RBC / HPF 0-5 0 - 5 RBC/hpf   WBC, UA 11-20 0 - 5 WBC/hpf   Bacteria, UA NONE SEEN NONE SEEN   Squamous Epithelial / LPF 11-20 0 - 5   Mucus PRESENT   Rapid urine drug screen (hospital performed)     Status: Abnormal   Collection Time: 07/03/20 10:18 AM  Result Value Ref Range   Opiates NONE DETECTED NONE DETECTED   Cocaine NONE DETECTED NONE DETECTED   Benzodiazepines NONE DETECTED  NONE DETECTED   Amphetamines NONE DETECTED NONE DETECTED   Tetrahydrocannabinol POSITIVE (A) NONE DETECTED   Barbiturates NONE DETECTED NONE DETECTED   Meds ordered this encounter  Medications  . promethazine (PHENERGAN) 25 mg in lactated ringers 1,000 mL infusion  . lactated ringers 1,000 mL with multivitamins adult (INFUVITE ADULT) 10 mL infusion    LITER #2  . famotidine (PEPCID) IVPB 20 mg premix  . scopolamine (TRANSDERM-SCOP) 1 MG/3DAYS 1.5 mg  . ondansetron (ZOFRAN) 8 mg in sodium chloride 0.9 % 50 mL IVPB  . lactated ringers infusion  . scopolamine (TRANSDERM-SCOP) 1 MG/3DAYS    Sig: Place 1 patch (1.5 mg total) onto the skin every 3 (three) days.    Dispense:  10 patch    Refill:  12    Order Specific Question:   Supervising Provider    Answer:   Jaynie Collins A  [[id number]]  . ranitidine (ZANTAC) 150 MG capsule    Sig: Take 1 capsule (150 mg total) by mouth daily.    Dispense:  30 capsule    Refill:  0    Order Specific Question:   Supervising Provider    Answer:   Jaynie Collins A [[id number]]  . haloperidol lactate (HALDOL) injection 5 mg  . diphenhydrAMINE (BENADRYL) injection 25 mg  . promethazine (PHENERGAN) 25 MG tablet    Sig: Take 1 tablet (25 mg total) by mouth every 6 (six) hours as needed for nausea or vomiting.    Dispense:  30 tablet    Refill:  2    Order Specific Question:   Supervising Provider    Answer:   Jaynie Collins A [[id number]]    Assessment and Plan  --20 y.o. G2P1001 at [redacted]w[redacted]d  --No ketonuria today --Cannabis Hyperemesis, abstinence advised --Encouraged to take existing prescriptions when available rather than skipping doses --Tolerating PO prior to discharge --Transient HTN, continue to monitor --Discharge home in stable condition  F/U: --Patient to reschedule New OB appointment for next week  Calvert Cantor, CNM 07/03/2020, 7:26 PM

## 2020-07-03 NOTE — Progress Notes (Signed)
   Patient in clinic for new ob exam. Patient reported weakness, lightheadedness, nausea and vomiting. Patient stated she only had 2 hours of sleep, unable to keep food/fluids down. Patient was prescribed Zofran, however no relief. Per patient's partner she is not eating/drinking or taking medications as prescribed. Called MAU, spoke with Raelyn Mora, CNM that patient was sent to MAU.   Clovis Pu, RN

## 2020-07-03 NOTE — MAU Note (Signed)
Sent from MD office, reports unable to keep anything down and BP elevated during visit.  Denies VB.

## 2020-07-06 ENCOUNTER — Encounter: Payer: Self-pay | Admitting: *Deleted

## 2020-07-16 ENCOUNTER — Other Ambulatory Visit: Payer: Self-pay

## 2020-07-16 ENCOUNTER — Other Ambulatory Visit (HOSPITAL_COMMUNITY)
Admission: RE | Admit: 2020-07-16 | Discharge: 2020-07-16 | Disposition: A | Discharge status: Home / Self Care | Payer: Medicaid Other | Source: Ambulatory Visit | Attending: Obstetrics and Gynecology | Admitting: Obstetrics and Gynecology

## 2020-07-16 ENCOUNTER — Encounter: Payer: Self-pay | Admitting: Obstetrics and Gynecology

## 2020-07-16 ENCOUNTER — Ambulatory Visit (INDEPENDENT_AMBULATORY_CARE_PROVIDER_SITE_OTHER): Payer: Medicaid Other | Admitting: Obstetrics and Gynecology

## 2020-07-16 VITALS — BP 116/74 | HR 114 | Temp 98.5°F | Wt 98.6 lb

## 2020-07-16 DIAGNOSIS — Z3A15 15 weeks gestation of pregnancy: Secondary | ICD-10-CM

## 2020-07-16 DIAGNOSIS — O219 Vomiting of pregnancy, unspecified: Secondary | ICD-10-CM | POA: Diagnosis not present

## 2020-07-16 DIAGNOSIS — Z3480 Encounter for supervision of other normal pregnancy, unspecified trimester: Secondary | ICD-10-CM | POA: Insufficient documentation

## 2020-07-16 DIAGNOSIS — O98811 Other maternal infectious and parasitic diseases complicating pregnancy, first trimester: Secondary | ICD-10-CM | POA: Diagnosis not present

## 2020-07-16 DIAGNOSIS — A749 Chlamydial infection, unspecified: Secondary | ICD-10-CM | POA: Diagnosis not present

## 2020-07-16 MED ORDER — ONDANSETRON 4 MG PO TBDP: 4.0000 mg | ORAL_TABLET | Freq: Three times a day (TID) | ORAL | 0 refills | Status: DC | PRN

## 2020-07-16 MED ORDER — AZITHROMYCIN 500 MG PO TABS
1000.0000 mg | ORAL_TABLET | Freq: Once | ORAL | Status: AC
  Administered 2020-07-16: 1000 mg via ORAL

## 2020-07-16 NOTE — Progress Notes (Signed)
INITIAL OBSTETRICAL VISIT Patient name: Deanna Cooper MRN [id number]  Date of birth: 2000/07/26 Chief Complaint:   Initial Prenatal Visit  History of Present Illness:   Deanna Cooper is a 20 y.o. G25P1001 African American female at [redacted]w[redacted]d by LMP with an Estimated Date of Delivery: 01/03/21 being seen today for her initial obstetrical visit.  Her obstetrical history is significant for (+) CT. This is an unplanned pregnancy. She and the father of the baby (FOB) "Kathlene November" live together. She has a support system that consists of the FOB her family/friends. Today she reports nausea.   Patient's last menstrual period was 04/01/2020. Last pap n/a. Results were: n/a Review of Systems:   Pertinent items are noted in HPI Denies cramping/contractions, leakage of fluid, vaginal bleeding, abnormal vaginal discharge w/ itching/odor/irritation, headaches, visual changes, shortness of breath, chest pain, abdominal pain, severe nausea/vomiting, or problems with urination or bowel movements unless otherwise stated above.  Pertinent History Reviewed:  Reviewed past medical,surgical, social, obstetrical and family history.  Reviewed problem list, medications and allergies. OB History  Gravida Para Term Preterm AB Living  2 1 1     1   SAB IAB Ectopic Multiple Live Births        0 1    # Outcome Date GA Lbr Len/2nd Weight Sex Delivery Anes PTL Lv  2 Current           1 Term 11/05/17 [redacted]w[redacted]d  6 lb 8.9 oz (2.975 kg) F CS-LTranv Spinal  LIV   Physical Assessment:   Vitals:   07/16/20 1014  BP: 116/74  Pulse: (!) 114  Temp: 98.5 F (36.9 C)  Weight: 98 lb 9.6 oz (44.7 kg)  Body mass index is 18.63 kg/m.       Physical Examination:  General appearance - well appearing, and in no distress  Mental status - alert, oriented to person, place, and time  Psych:  She has a normal mood and affect  Skin - warm and dry, normal color, no suspicious lesions noted  Chest - effort normal, all lung fields clear to  auscultation bilaterally  Heart - normal rate and regular rhythm  Abdomen - soft, nontender  Extremities:  No swelling or varicosities noted  Pelvic - VULVA: normal appearing vulva with no masses, tenderness or lesions  VAGINA: normal appearing vagina with normal color and discharge, no lesions.   CERVIX: normal appearing cervix without discharge or lesions, no CMT  Thin prep pap is done with reflex HPV cotesting   FHTs by doppler: 155 bpm  Assessment & Plan:  1) Low -Risk Pregnancy G2P1001 at [redacted]w[redacted]d with an Estimated Date of Delivery: 01/03/21   2) Initial OB visit - Welcomed to practice and introduced self to patient in addition to discussing other advanced practice providers that she may be seeing at this practice - Congratulated patient - Anticipatory guidance on upcoming appointments - Educated on COVID19 and pregnancy and the integration of virtual appointments  - Educated on babyscripts app- patient reports she has not received email, encouraged to look in spam folder and to call office if she still has not received email - patient verbalizes understanding    3) Supervision of other normal pregnancy, antepartum - Cervicovaginal ancillary only( [place]) - Cytology - PAP( [place]) - AFP, Serum, Open Spina Bifida  4) Nausea and vomiting during pregnancy prior to [redacted] weeks gestation  - Plan: Sample of ondansetron (ZOFRAN ODT) 4 MG disintegrating tablet,   5) Chlamydia infection affecting pregnancy in  first trimester, antepartum - azithromycin (ZITHROMAX) tablet 1,000 mg given to patient to take at home after eating  6) [redacted] weeks gestation of pregnancy    Meds:  Meds ordered this encounter  Medications  . DISCONTD: ondansetron (ZOFRAN ODT) 4 MG disintegrating tablet    Sig: Take 1 tablet (4 mg total) by mouth every 8 (eight) hours as needed for nausea or vomiting.    Dispense:  20 tablet    Refill:  0  . ondansetron (ZOFRAN ODT) 4 MG disintegrating tablet    Sig: Take 1  tablet (4 mg total) by mouth every 8 (eight) hours as needed for nausea or vomiting.    Dispense:  20 tablet    Refill:  0    Order Specific Question:   Lot Number?    Answer:   [id number]    Order Specific Question:   Expiration Date?    Answer:   02/03/2022    Order Specific Question:   Quantity    Answer:   20  . azithromycin (ZITHROMAX) tablet 1,000 mg    Initial labs obtained Continue prenatal vitamins Reviewed n/v relief measures and warning s/s to report Reviewed recommended weight gain based on pre-gravid BMI Encouraged well-balanced diet Genetic Screening discussed: results reviewed Cystic fibrosis, SMA, Fragile X screening discussed results reviewed The nature of [facility] with multiple MDs and other Advanced Practice Providers was explained to patient; also emphasized that residents, students are part of our team.  Discussed optimized OB schedule and video visits. Advised can have an in-office visit whenever she feels she needs to be seen.  Does not have own BP cuff. BP cuff Rx faxed today. Explained to patient that BP will be mailed to her house. Check BP weekly, let us know if >140/90. Advised to call during normal business hours and there is an after-hours nurse line available.    Follow-up: Return in about 4 weeks (around 08/13/2020) for Return OB visit.   Orders Placed This Encounter  Procedures  . [place] MFM OB COMP + 14 WK  . AFP, Serum, Open Spina Bifida    Raelyn Mora MSN, CNM 07/16/2020

## 2020-07-17 LAB — CERVICOVAGINAL ANCILLARY ONLY
Bacterial Vaginitis (gardnerella): POSITIVE — AB
Candida Glabrata: NEGATIVE
Candida Vaginitis: NEGATIVE
Chlamydia: POSITIVE — AB
Comment: NEGATIVE
Comment: NEGATIVE
Comment: NEGATIVE
Comment: NEGATIVE
Comment: NEGATIVE
Comment: NORMAL
Neisseria Gonorrhea: NEGATIVE
Trichomonas: NEGATIVE

## 2020-07-18 LAB — AFP, SERUM, OPEN SPINA BIFIDA
AFP MoM: 0.98
AFP Value: 43 ng/mL
Gest. Age on Collection Date: 15 weeks
Maternal Age At EDD: 21.9 yr
OSBR Risk 1 IN: 10000
Test Results:: NEGATIVE
Weight: 98 [lb_av]

## 2020-07-20 ENCOUNTER — Telehealth: Payer: Self-pay | Admitting: *Deleted

## 2020-07-20 DIAGNOSIS — B9689 Other specified bacterial agents as the cause of diseases classified elsewhere: Secondary | ICD-10-CM

## 2020-07-20 DIAGNOSIS — N760 Acute vaginitis: Secondary | ICD-10-CM

## 2020-07-20 MED ORDER — METRONIDAZOLE 500 MG PO TABS: 500.0000 mg | ORAL_TABLET | Freq: Two times a day (BID) | ORAL | 0 refills | Status: DC

## 2020-07-20 NOTE — Telephone Encounter (Signed)
-----   Message from Raelyn Mora, [place] sent at 07/18/2020  9:36 AM EST ----- Please treat for BV. (+) Chlamydia -- medications given on 07/16/2020.

## 2020-07-21 LAB — CYTOLOGY - PAP
Comment: NEGATIVE
Diagnosis: UNDETERMINED — AB
High risk HPV: POSITIVE — AB

## 2020-08-12 ENCOUNTER — Ambulatory Visit (INDEPENDENT_AMBULATORY_CARE_PROVIDER_SITE_OTHER): Payer: Medicaid Other | Admitting: Obstetrics and Gynecology

## 2020-08-12 ENCOUNTER — Other Ambulatory Visit: Payer: Self-pay

## 2020-08-12 ENCOUNTER — Other Ambulatory Visit (HOSPITAL_COMMUNITY)
Admission: RE | Admit: 2020-08-12 | Discharge: 2020-08-12 | Disposition: A | Discharge status: Home / Self Care | Payer: Medicaid Other | Source: Ambulatory Visit | Attending: Obstetrics and Gynecology | Admitting: Obstetrics and Gynecology

## 2020-08-12 VITALS — BP 113/67 | HR 101 | Temp 97.8°F | Wt 112.8 lb

## 2020-08-12 DIAGNOSIS — Z3A19 19 weeks gestation of pregnancy: Secondary | ICD-10-CM

## 2020-08-12 DIAGNOSIS — O98811 Other maternal infectious and parasitic diseases complicating pregnancy, first trimester: Secondary | ICD-10-CM

## 2020-08-12 DIAGNOSIS — A749 Chlamydial infection, unspecified: Secondary | ICD-10-CM | POA: Insufficient documentation

## 2020-08-12 DIAGNOSIS — Z3480 Encounter for supervision of other normal pregnancy, unspecified trimester: Secondary | ICD-10-CM

## 2020-08-12 DIAGNOSIS — R3989 Other symptoms and signs involving the genitourinary system: Secondary | ICD-10-CM | POA: Diagnosis not present

## 2020-08-12 DIAGNOSIS — O210 Mild hyperemesis gravidarum: Secondary | ICD-10-CM

## 2020-08-12 DIAGNOSIS — R8290 Unspecified abnormal findings in urine: Secondary | ICD-10-CM | POA: Diagnosis not present

## 2020-08-12 DIAGNOSIS — Z113 Encounter for screening for infections with a predominantly sexual mode of transmission: Secondary | ICD-10-CM

## 2020-08-12 LAB — POCT URINALYSIS DIPSTICK OB
Bilirubin, UA: NEGATIVE
Blood, UA: NEGATIVE
Glucose, UA: NEGATIVE
Ketones, UA: NEGATIVE
Nitrite, UA: NEGATIVE
Spec Grav, UA: 1.02 (ref 1.010–1.025)
Urobilinogen, UA: 1 E.U./dL
pH, UA: 8.5 — AB (ref 5.0–8.0)

## 2020-08-12 MED ORDER — ONDANSETRON 8 MG PO TBDP: 8.0000 mg | ORAL_TABLET | Freq: Three times a day (TID) | ORAL | 1 refills | Status: DC | PRN

## 2020-08-12 NOTE — Progress Notes (Addendum)
LOW-RISK PREGNANCY OFFICE VISIT Patient name: Deanna Cooper MRN [id number]  Date of birth: 12-Mar-2001 Chief Complaint:   Routine Prenatal Visit  History of Present Illness:   Deanna Cooper is a 20 y.o. G40P1001 female at [redacted]w[redacted]d with an Estimated Date of Delivery: 01/03/21 being seen today for ongoing management of a low-risk pregnancy.  Today she reports nausea. She reports she has Rx for N/V, but "not able to pick them up because money is tight and can't afford to pick up the medication. Boyfriend went to pick up prescription and it was $89." Contractions: Not present. Vag. Bleeding: None.  Movement: Present. denies leaking of fluid.  Wants to discuss BTL. She wants to know "if it (BTL) can be done now?" Review of Systems:   Pertinent items are noted in HPI Denies abnormal vaginal discharge w/ itching/odor/irritation, headaches, visual changes, shortness of breath, chest pain, abdominal pain, severe nausea/vomiting, or problems with urination or bowel movements unless otherwise stated above. Pertinent History Reviewed:  Reviewed past medical,surgical, social, obstetrical and family history.  Reviewed problem list, medications and allergies. Physical Assessment:   Vitals:   08/12/20 0902  BP: 113/67  Pulse: (!) 101  Temp: 97.8 F (36.6 C)  Weight: 112 lb 12.8 oz (51.2 kg)  Body mass index is 21.31 kg/m.        Physical Examination:   General appearance: Well appearing, and in no distress  Mental status: Alert, oriented to person, place, and time  Skin: Warm & dry  Cardiovascular: Normal heart rate noted  Respiratory: Normal respiratory effort, no distress  Abdomen: Soft, gravid, nontender  Pelvic: Cervical exam deferred         Extremities: Edema: Trace  Fetal Status: Fetal Heart Rate (bpm): 146 Fundal Height: 20 cm Movement: Present    No results found for this or any previous visit (from the past 24 hour(s)).  Assessment & Plan:  1) Low-risk pregnancy G2P1001 at [redacted]w[redacted]d with  an Estimated Date of Delivery: 01/03/21   2) Supervision of other normal pregnancy, antepartum - Gwyndolyn Saxon, LCSW assisting patient with Rx at [facility] - Advised of timing OB visits - in-person only visits - Advised that BTL is a surgery that would happen after the delivery of the baby; can be done immediately PP or at 6 wks PP - Advised that BTL discussions would have to occur with one of the surgeons in our practice; appt can be made for that  3) Screening examination for STD (sexually transmitted disease)  - Urine cytology ancillary only([place])  4) Chlamydia infection affecting pregnancy in first trimester, antepartum  - Urine cytology ancillary only([place])  5) Morning sickness - Rx for Zofran 8 mg ODT every 8 hours sent to pharmacy on file  6) [redacted] weeks gestation of pregnancy    Meds:  Meds ordered this encounter  Medications   ondansetron (ZOFRAN ODT) 8 MG disintegrating tablet    Sig: Take 1 tablet (8 mg total) by mouth every 8 (eight) hours as needed for nausea or vomiting.    Dispense:  30 tablet    Refill:  1   Labs/procedures today: GC/CT TOC on urine specimen  Plan:  Continue routine obstetrical care   Reviewed: Preterm labor symptoms and general obstetric precautions including but not limited to vaginal bleeding, contractions, leaking of fluid and fetal movement were reviewed in detail with the patient.  All questions were answered. Has home bp cuff. Check bp weekly, let us know if >140/90.   Follow-up:  Return in about 4 weeks (around 09/09/2020) for Return OB visit.  Orders Placed This Encounter  Procedures   Culture, OB Urine   Urine Culture, OB Reflex   POC Urinalysis Dipstick OB   Raelyn Mora MSN, CNM 08/12/2020 9:27 AM

## 2020-08-13 ENCOUNTER — Telehealth: Payer: Self-pay | Admitting: *Deleted

## 2020-08-13 LAB — URINE CYTOLOGY ANCILLARY ONLY
Chlamydia: POSITIVE — AB
Comment: NEGATIVE
Comment: NEGATIVE
Comment: NORMAL
Neisseria Gonorrhea: NEGATIVE
Trichomonas: NEGATIVE

## 2020-08-13 NOTE — Telephone Encounter (Signed)
-----   Message from Raelyn Mora, [place] sent at 08/13/2020  3:39 PM EST ----- (+) CT Please treat in the office

## 2020-08-13 NOTE — Telephone Encounter (Signed)
Left voice message for patient to return nurse call regarding lab results. Will send a Mychart message.  Clovis Pu, RN

## 2020-08-17 ENCOUNTER — Ambulatory Visit: Payer: Medicaid Other | Attending: Obstetrics and Gynecology

## 2020-08-17 ENCOUNTER — Other Ambulatory Visit: Payer: Self-pay | Admitting: *Deleted

## 2020-08-17 ENCOUNTER — Other Ambulatory Visit: Payer: Self-pay

## 2020-08-17 DIAGNOSIS — O34219 Maternal care for unspecified type scar from previous cesarean delivery: Secondary | ICD-10-CM | POA: Diagnosis not present

## 2020-08-17 DIAGNOSIS — Z3A19 19 weeks gestation of pregnancy: Secondary | ICD-10-CM

## 2020-08-17 DIAGNOSIS — Z363 Encounter for antenatal screening for malformations: Secondary | ICD-10-CM

## 2020-08-17 DIAGNOSIS — Z3480 Encounter for supervision of other normal pregnancy, unspecified trimester: Secondary | ICD-10-CM | POA: Insufficient documentation

## 2020-08-17 DIAGNOSIS — R6252 Short stature (child): Secondary | ICD-10-CM

## 2020-08-17 LAB — URINE CULTURE, OB REFLEX

## 2020-08-17 LAB — CULTURE, OB URINE

## 2020-08-17 NOTE — Telephone Encounter (Signed)
Left voice message for patient to return nurse call.  Martin, Tamika L, RN  

## 2020-08-18 NOTE — Telephone Encounter (Signed)
Left voice message for patient to return nurse call regarding lab result.  Clovis Pu, RN

## 2020-08-26 NOTE — Telephone Encounter (Signed)
Left voice message for patient to return nurse call.  Report sent to [facility].  Clovis Pu, RN

## 2020-09-09 ENCOUNTER — Ambulatory Visit (INDEPENDENT_AMBULATORY_CARE_PROVIDER_SITE_OTHER): Payer: Medicaid Other | Admitting: Family Medicine

## 2020-09-09 ENCOUNTER — Encounter: Payer: Self-pay | Admitting: Family Medicine

## 2020-09-09 VITALS — BP 105/66 | HR 93 | Wt 112.1 lb

## 2020-09-09 DIAGNOSIS — Z98891 History of uterine scar from previous surgery: Secondary | ICD-10-CM

## 2020-09-09 DIAGNOSIS — O98811 Other maternal infectious and parasitic diseases complicating pregnancy, first trimester: Secondary | ICD-10-CM

## 2020-09-09 DIAGNOSIS — A749 Chlamydial infection, unspecified: Secondary | ICD-10-CM | POA: Diagnosis not present

## 2020-09-09 DIAGNOSIS — O98812 Other maternal infectious and parasitic diseases complicating pregnancy, second trimester: Secondary | ICD-10-CM | POA: Diagnosis not present

## 2020-09-09 DIAGNOSIS — A568 Sexually transmitted chlamydial infection of other sites: Secondary | ICD-10-CM | POA: Insufficient documentation

## 2020-09-09 DIAGNOSIS — Z3480 Encounter for supervision of other normal pregnancy, unspecified trimester: Secondary | ICD-10-CM

## 2020-09-09 DIAGNOSIS — R21 Rash and other nonspecific skin eruption: Secondary | ICD-10-CM

## 2020-09-09 MED ORDER — AZITHROMYCIN 500 MG PO TABS: 500.0000 mg | ORAL_TABLET | Freq: Once | ORAL | Status: DC

## 2020-09-09 MED ORDER — AZITHROMYCIN 500 MG PO TABS
1000.0000 mg | ORAL_TABLET | Freq: Once | ORAL | Status: AC
  Administered 2020-09-09: 1000 mg via ORAL

## 2020-09-09 MED ORDER — LORATADINE 10 MG PO TABS: 10.0000 mg | ORAL_TABLET | Freq: Every day | ORAL | 5 refills | Status: DC

## 2020-09-09 NOTE — Progress Notes (Signed)
Pt treated w/ Zithromax 1000 mg today.

## 2020-09-09 NOTE — Patient Instructions (Signed)
 Contraception Choices Contraception, also called birth control, refers to methods or devices that prevent pregnancy. Hormonal methods Contraceptive implant A contraceptive implant is a thin, plastic tube that contains a hormone that prevents pregnancy. It is different from an intrauterine device (IUD). It is inserted into the upper part of the arm by a health care provider. Implants can be effective for up to 3 years. Progestin-only injections Progestin-only injections are injections of progestin, a synthetic form of the hormone progesterone. They are given every 3 months by a health care provider. Birth control pills Birth control pills are pills that contain hormones that prevent pregnancy. They must be taken once a day, preferably at the same time each day. A prescription is needed to use this method of contraception. Birth control patch The birth control patch contains hormones that prevent pregnancy. It is placed on the skin and must be changed once a week for three weeks and removed on the fourth week. A prescription is needed to use this method of contraception. Vaginal ring A vaginal ring contains hormones that prevent pregnancy. It is placed in the vagina for three weeks and removed on the fourth week. After that, the process is repeated with a new ring. A prescription is needed to use this method of contraception. Emergency contraceptive Emergency contraceptives prevent pregnancy after unprotected sex. They come in pill form and can be taken up to 5 days after sex. They work best the sooner they are taken after having sex. Most emergency contraceptives are available without a prescription. This method should not be used as your only form of birth control.   Barrier methods Female condom A female condom is a thin sheath that is worn over the penis during sex. Condoms keep sperm from going inside a woman's body. They can be used with a sperm-killing substance (spermicide) to increase their  effectiveness. They should be thrown away after one use. Female condom A female condom is a soft, loose-fitting sheath that is put into the vagina before sex. The condom keeps sperm from going inside a woman's body. They should be thrown away after one use. Diaphragm A diaphragm is a soft, dome-shaped barrier. It is inserted into the vagina before sex, along with a spermicide. The diaphragm blocks sperm from entering the uterus, and the spermicide kills sperm. A diaphragm should be left in the vagina for 6-8 hours after sex and removed within 24 hours. A diaphragm is prescribed and fitted by a health care provider. A diaphragm should be replaced every 1-2 years, after giving birth, after gaining more than 15 lb (6.8 kg), and after pelvic surgery. Cervical cap A cervical cap is a round, soft latex or plastic cup that fits over the cervix. It is inserted into the vagina before sex, along with spermicide. It blocks sperm from entering the uterus. The cap should be left in place for 6-8 hours after sex and removed within 48 hours. A cervical cap must be prescribed and fitted by a health care provider. It should be replaced every 2 years. Sponge A sponge is a soft, circular piece of polyurethane foam with spermicide in it. The sponge helps block sperm from entering the uterus, and the spermicide kills sperm. To use it, you make it wet and then insert it into the vagina. It should be inserted before sex, left in for at least 6 hours after sex, and removed and thrown away within 30 hours. Spermicides Spermicides are chemicals that kill or block sperm from entering the   cervix and uterus. They can come as a cream, jelly, suppository, foam, or tablet. A spermicide should be inserted into the vagina with an applicator at least 10-15 minutes before sex to allow time for it to work. The process must be repeated every time you have sex. Spermicides do not require a prescription.   Intrauterine  contraception Intrauterine device (IUD) An IUD is a T-shaped device that is put in a woman's uterus. There are two types:  Hormone IUD.This type contains progestin, a synthetic form of the hormone progesterone. This type can stay in place for 3-5 years.  Copper IUD.This type is wrapped in copper wire. It can stay in place for 10 years. Permanent methods of contraception Female tubal ligation In this method, a woman's fallopian tubes are sealed, tied, or blocked during surgery to prevent eggs from traveling to the uterus. Hysteroscopic sterilization In this method, a small, flexible insert is placed into each fallopian tube. The inserts cause scar tissue to form in the fallopian tubes and block them, so sperm cannot reach an egg. The procedure takes about 3 months to be effective. Another form of birth control must be used during those 3 months. Female sterilization This is a procedure to tie off the tubes that carry sperm (vasectomy). After the procedure, the man can still ejaculate fluid (semen). Another form of birth control must be used for 3 months after the procedure. Natural planning methods Natural family planning In this method, a couple does not have sex on days when the woman could become pregnant. Calendar method In this method, the woman keeps track of the length of each menstrual cycle, identifies the days when pregnancy can happen, and does not have sex on those days. Ovulation method In this method, a couple avoids sex during ovulation. Symptothermal method This method involves not having sex during ovulation. The woman typically checks for ovulation by watching changes in her temperature and in the consistency of cervical mucus. Post-ovulation method In this method, a couple waits to have sex until after ovulation. Where to find more information  Centers for Disease Control and Prevention: www.cdc.gov Summary  Contraception, also called birth control, refers to methods or  devices that prevent pregnancy.  Hormonal methods of contraception include implants, injections, pills, patches, vaginal rings, and emergency contraceptives.  Barrier methods of contraception can include female condoms, female condoms, diaphragms, cervical caps, sponges, and spermicides.  There are two types of IUDs (intrauterine devices). An IUD can be put in a woman's uterus to prevent pregnancy for 3-5 years.  Permanent sterilization can be done through a procedure for males and females. Natural family planning methods involve nothaving sex on days when the woman could become pregnant. This information is not intended to replace advice given to you by your health care provider. Make sure you discuss any questions you have with your health care provider. Document Revised: 10/28/2019 Document Reviewed: 10/28/2019 Elsevier Patient Education  2021 [employer] Inc.   Breastfeeding  Choosing to breastfeed is one of the best decisions you can make for yourself and your baby. A change in hormones during pregnancy causes your breasts to make breast milk in your milk-producing glands. Hormones prevent breast milk from being released before your baby is born. They also prompt milk flow after birth. Once breastfeeding has begun, thoughts of your baby, as well as his or her sucking or crying, can stimulate the release of milk from your milk-producing glands. Benefits of breastfeeding Research shows that breastfeeding offers many health benefits   for infants and mothers. It also offers a cost-free and convenient way to feed your baby. For your baby  Your first milk (colostrum) helps your baby's digestive system to function better.  Special cells in your milk (antibodies) help your baby to fight off infections.  Breastfed babies are less likely to develop asthma, allergies, obesity, or type 2 diabetes. They are also at lower risk for sudden infant death syndrome (SIDS).  Nutrients in breast milk are better  able to meet your baby's needs compared to infant formula.  Breast milk improves your baby's brain development. For you  Breastfeeding helps to create a very special bond between you and your baby.  Breastfeeding is convenient. Breast milk costs nothing and is always available at the correct temperature.  Breastfeeding helps to burn calories. It helps you to lose the weight that you gained during pregnancy.  Breastfeeding makes your uterus return faster to its size before pregnancy. It also slows bleeding (lochia) after you give birth.  Breastfeeding helps to lower your risk of developing type 2 diabetes, osteoporosis, rheumatoid arthritis, cardiovascular disease, and breast, ovarian, uterine, and endometrial cancer later in life. Breastfeeding basics Starting breastfeeding  Find a comfortable place to sit or lie down, with your neck and back well-supported.  Place a pillow or a rolled-up blanket under your baby to bring him or her to the level of your breast (if you are seated). Nursing pillows are specially designed to help support your arms and your baby while you breastfeed.  Make sure that your baby's tummy (abdomen) is facing your abdomen.  Gently massage your breast. With your fingertips, massage from the outer edges of your breast inward toward the nipple. This encourages milk flow. If your milk flows slowly, you may need to continue this action during the feeding.  Support your breast with 4 fingers underneath and your thumb above your nipple (make the letter "C" with your hand). Make sure your fingers are well away from your nipple and your baby's mouth.  Stroke your baby's lips gently with your finger or nipple.  When your baby's mouth is open wide enough, quickly bring your baby to your breast, placing your entire nipple and as much of the areola as possible into your baby's mouth. The areola is the colored area around your nipple. ? More areola should be visible above your  baby's upper lip than below the lower lip. ? Your baby's lips should be opened and extended outward (flanged) to ensure an adequate, comfortable latch. ? Your baby's tongue should be between his or her lower gum and your breast.  Make sure that your baby's mouth is correctly positioned around your nipple (latched). Your baby's lips should create a seal on your breast and be turned out (everted).  It is common for your baby to suck about 2-3 minutes in order to start the flow of breast milk. Latching Teaching your baby how to latch onto your breast properly is very important. An improper latch can cause nipple pain, decreased milk supply, and poor weight gain in your baby. Also, if your baby is not latched onto your nipple properly, he or she may swallow some air during feeding. This can make your baby fussy. Burping your baby when you switch breasts during the feeding can help to get rid of the air. However, teaching your baby to latch on properly is still the best way to prevent fussiness from swallowing air while breastfeeding. Signs that your baby has successfully latched onto   your nipple  Silent tugging or silent sucking, without causing you pain. Infant's lips should be extended outward (flanged).  Swallowing heard between every 3-4 sucks once your milk has started to flow (after your let-down milk reflex occurs).  Muscle movement above and in front of his or her ears while sucking. Signs that your baby has not successfully latched onto your nipple  Sucking sounds or smacking sounds from your baby while breastfeeding.  Nipple pain. If you think your baby has not latched on correctly, slip your finger into the corner of your baby's mouth to break the suction and place it between your baby's gums. Attempt to start breastfeeding again. Signs of successful breastfeeding Signs from your baby  Your baby will gradually decrease the number of sucks or will completely stop sucking.  Your baby  will fall asleep.  Your baby's body will relax.  Your baby will retain a small amount of milk in his or her mouth.  Your baby will let go of your breast by himself or herself. Signs from you  Breasts that have increased in firmness, weight, and size 1-3 hours after feeding.  Breasts that are softer immediately after breastfeeding.  Increased milk volume, as well as a change in milk consistency and color by the fifth day of breastfeeding.  Nipples that are not sore, cracked, or bleeding. Signs that your baby is getting enough milk  Wetting at least 1-2 diapers during the first 24 hours after birth.  Wetting at least 5-6 diapers every 24 hours for the first week after birth. The urine should be clear or pale yellow by the age of 5 days.  Wetting 6-8 diapers every 24 hours as your baby continues to grow and develop.  At least 3 stools in a 24-hour period by the age of 5 days. The stool should be soft and yellow.  At least 3 stools in a 24-hour period by the age of 7 days. The stool should be seedy and yellow.  No loss of weight greater than 10% of birth weight during the first 3 days of life.  Average weight gain of 4-7 oz (113-198 g) per week after the age of 4 days.  Consistent daily weight gain by the age of 5 days, without weight loss after the age of 2 weeks. After a feeding, your baby may spit up a small amount of milk. This is normal. Breastfeeding frequency and duration Frequent feeding will help you make more milk and can prevent sore nipples and extremely full breasts (breast engorgement). Breastfeed when you feel the need to reduce the fullness of your breasts or when your baby shows signs of hunger. This is called "breastfeeding on demand." Signs that your baby is hungry include:  Increased alertness, activity, or restlessness.  Movement of the head from side to side.  Opening of the mouth when the corner of the mouth or cheek is stroked (rooting).  Increased  sucking sounds, smacking lips, cooing, sighing, or squeaking.  Hand-to-mouth movements and sucking on fingers or hands.  Fussing or crying. Avoid introducing a pacifier to your baby in the first 4-6 weeks after your baby is born. After this time, you may choose to use a pacifier. Research has shown that pacifier use during the first year of a baby's life decreases the risk of sudden infant death syndrome (SIDS). Allow your baby to feed on each breast as long as he or she wants. When your baby unlatches or falls asleep while feeding from the   first breast, offer the second breast. Because newborns are often sleepy in the first few weeks of life, you may need to awaken your baby to get him or her to feed. Breastfeeding times will vary from baby to baby. However, the following rules can serve as a guide to help you make sure that your baby is properly fed:  Newborns (babies 4 weeks of age or younger) may breastfeed every 1-3 hours.  Newborns should not go without breastfeeding for longer than 3 hours during the day or 5 hours during the night.  You should breastfeed your baby a minimum of 8 times in a 24-hour period. Breast milk pumping Pumping and storing breast milk allows you to make sure that your baby is exclusively fed your breast milk, even at times when you are unable to breastfeed. This is especially important if you go back to work while you are still breastfeeding, or if you are not able to be present during feedings. Your lactation consultant can help you find a method of pumping that works best for you and give you guidelines about how long it is safe to store breast milk.      Caring for your breasts while you breastfeed Nipples can become dry, cracked, and sore while breastfeeding. The following recommendations can help keep your breasts moisturized and healthy:  Avoid using soap on your nipples.  Wear a supportive bra designed especially for nursing. Avoid wearing underwire-style  bras or extremely tight bras (sports bras).  Air-dry your nipples for 3-4 minutes after each feeding.  Use only cotton bra pads to absorb leaked breast milk. Leaking of breast milk between feedings is normal.  Use lanolin on your nipples after breastfeeding. Lanolin helps to maintain your skin's normal moisture barrier. Pure lanolin is not harmful (not toxic) to your baby. You may also hand express a few drops of breast milk and gently massage that milk into your nipples and allow the milk to air-dry. In the first few weeks after giving birth, some women experience breast engorgement. Engorgement can make your breasts feel heavy, warm, and tender to the touch. Engorgement peaks within 3-5 days after you give birth. The following recommendations can help to ease engorgement:  Completely empty your breasts while breastfeeding or pumping. You may want to start by applying warm, moist heat (in the shower or with warm, water-soaked hand towels) just before feeding or pumping. This increases circulation and helps the milk flow. If your baby does not completely empty your breasts while breastfeeding, pump any extra milk after he or she is finished.  Apply ice packs to your breasts immediately after breastfeeding or pumping, unless this is too uncomfortable for you. To do this: ? Put ice in a plastic bag. ? Place a towel between your skin and the bag. ? Leave the ice on for 20 minutes, 2-3 times a day.  Make sure that your baby is latched on and positioned properly while breastfeeding. If engorgement persists after 48 hours of following these recommendations, contact your health care provider or a lactation consultant. Overall health care recommendations while breastfeeding  Eat 3 healthy meals and 3 snacks every day. Well-nourished mothers who are breastfeeding need an additional 450-500 calories a day. You can meet this requirement by increasing the amount of a balanced diet that you eat.  Drink  enough water to keep your urine pale yellow or clear.  Rest often, relax, and continue to take your prenatal vitamins to prevent fatigue, stress, and low   vitamin and mineral levels in your body (nutrient deficiencies).  Do not use any products that contain nicotine or tobacco, such as cigarettes and e-cigarettes. Your baby may be harmed by chemicals from cigarettes that pass into breast milk and exposure to secondhand smoke. If you need help quitting, ask your health care provider.  Avoid alcohol.  Do not use illegal drugs or marijuana.  Talk with your health care provider before taking any medicines. These include over-the-counter and prescription medicines as well as vitamins and herbal supplements. Some medicines that may be harmful to your baby can pass through breast milk.  It is possible to become pregnant while breastfeeding. If birth control is desired, ask your health care provider about options that will be safe while breastfeeding your baby. Where to find more information: [employer]: www.llli.org Contact a health care provider if:  You feel like you want to stop breastfeeding or have become frustrated with breastfeeding.  Your nipples are cracked or bleeding.  Your breasts are red, tender, or warm.  You have: ? Painful breasts or nipples. ? A swollen area on either breast. ? A fever or chills. ? Nausea or vomiting. ? Drainage other than breast milk from your nipples.  Your breasts do not become full before feedings by the fifth day after you give birth.  You feel sad and depressed.  Your baby is: ? Too sleepy to eat well. ? Having trouble sleeping. ? More than 1 week old and wetting fewer than 6 diapers in a 24-hour period. ? Not gaining weight by 5 days of age.  Your baby has fewer than 3 stools in a 24-hour period.  Your baby's skin or the white parts of his or her eyes become yellow. Get help right away if:  Your baby is overly tired  (lethargic) and does not want to wake up and feed.  Your baby develops an unexplained fever. Summary  Breastfeeding offers many health benefits for infant and mothers.  Try to breastfeed your infant when he or she shows early signs of hunger.  Gently tickle or stroke your baby's lips with your finger or nipple to allow the baby to open his or her mouth. Bring the baby to your breast. Make sure that much of the areola is in your baby's mouth. Offer one side and burp the baby before you offer the other side.  Talk with your health care provider or lactation consultant if you have questions or you face problems as you breastfeed. This information is not intended to replace advice given to you by your health care provider. Make sure you discuss any questions you have with your health care provider. Document Revised: 08/17/2017 Document Reviewed: 06/24/2016 Elsevier Patient Education  2021 Elsevier Inc.  

## 2020-09-09 NOTE — Addendum Note (Signed)
Addended by: Henrietta Dine on: 09/09/2020 11:01 AM   Modules accepted: Orders

## 2020-09-09 NOTE — Progress Notes (Signed)
   Subjective:  Deanna Cooper is a 20 y.o. G2P1001 at [redacted]w[redacted]d being seen today for ongoing prenatal care.  She is currently monitored for the following issues for this high-risk pregnancy and has Anemia affecting pregnancy; Prenatal care in third trimester; Vaginal bleeding in pregnancy, third trimester; Status post primary low transverse cesarean section; Placenta abruptio; Supervision of other normal pregnancy, antepartum; and Chlamydia infection affecting pregnancy on their problem list.  Patient reports no complaints.  Contractions: Irritability. Vag. Bleeding: None.  Movement: Present. Denies leaking of fluid.   The following portions of the patient's history were reviewed and updated as appropriate: allergies, current medications, past family history, past medical history, past social history, past surgical history and problem list. Problem list updated.  Objective:   Vitals:   09/09/20 0953  BP: 105/66  Pulse: 93  Weight: 112 lb 1.6 oz (50.8 kg)    Fetal Status: Fetal Heart Rate (bpm): 143   Movement: Present     General:  Alert, oriented and cooperative. Patient is in no acute distress.  Skin: Skin is warm and dry. No rash noted.   Cardiovascular: Normal heart rate noted  Respiratory: Normal respiratory effort, no problems with respiration noted  Abdomen: Soft, gravid, appropriate for gestational age. Pain/Pressure: Present     Pelvic: Vag. Bleeding: None     Cervical exam deferred        Extremities: Normal range of motion.  Edema: Trace  Mental Status: Normal mood and affect. Normal behavior. Normal judgment and thought content.   Urinalysis:      Assessment and Plan:  Pregnancy: G2P1001 at [redacted]w[redacted]d  1. Chlamydia infection affecting pregnancy in first trimester, antepartum Chart reviewed Positive TOC on 08/12/2020, per chart no Azithromycin has been prescribed and [facility] has been unable to reach the patient Will treat here today, strongly recommended that her partner get  treatment as well, no intercourse for one week, use protection consistently until TOC in 4 weeks  2. Supervision of other normal pregnancy, antepartum BP and FHR normal Discussed contraception, thinking about BTL. She is considering only having two children. Discussed permanence of this method and reviewed alternative options available either immediately post partum or during cesarean Patient is interested in IUD, she will continue to consider her options Referred to [facility] for more information  3. Status post primary low transverse cesarean section OK for TOLAC based on op note and prior documentation Patient was thinking about having another cesarean because "my belly is big, they might not fit" Discussed that after two cesareans will not be given option to TOLAC in the future Counseled on risks and benefits of TOLAC, still undecided but given VBAC consent and will continue to consider, seems to be leaning towards TOLAC  4. Rash Reports pruritic rash on arms and chest for past week No itching on palms or soles of feet Rx claritin  Preterm labor symptoms and general obstetric precautions including but not limited to vaginal bleeding, contractions, leaking of fluid and fetal movement were reviewed in detail with the patient. Please refer to After Visit Summary for other counseling recommendations.  Return in 4 weeks (on 10/07/2020) for ob visit.   Venora Maples, MD

## 2020-09-10 ENCOUNTER — Encounter: Payer: Medicaid Other | Admitting: Obstetrics and Gynecology

## 2020-09-15 ENCOUNTER — Ambulatory Visit: Payer: Medicaid Other | Admitting: *Deleted

## 2020-09-15 ENCOUNTER — Other Ambulatory Visit: Payer: Self-pay | Admitting: *Deleted

## 2020-09-15 ENCOUNTER — Encounter: Payer: Self-pay | Admitting: *Deleted

## 2020-09-15 ENCOUNTER — Other Ambulatory Visit: Payer: Self-pay | Admitting: Obstetrics and Gynecology

## 2020-09-15 ENCOUNTER — Other Ambulatory Visit: Payer: Self-pay

## 2020-09-15 ENCOUNTER — Ambulatory Visit: Payer: Medicaid Other | Attending: Obstetrics and Gynecology

## 2020-09-15 DIAGNOSIS — O34219 Maternal care for unspecified type scar from previous cesarean delivery: Secondary | ICD-10-CM | POA: Diagnosis not present

## 2020-09-15 DIAGNOSIS — O365920 Maternal care for other known or suspected poor fetal growth, second trimester, not applicable or unspecified: Secondary | ICD-10-CM

## 2020-09-15 DIAGNOSIS — O402XX0 Polyhydramnios, second trimester, not applicable or unspecified: Secondary | ICD-10-CM

## 2020-09-15 DIAGNOSIS — Z3480 Encounter for supervision of other normal pregnancy, unspecified trimester: Secondary | ICD-10-CM

## 2020-09-15 DIAGNOSIS — Z3A23 23 weeks gestation of pregnancy: Secondary | ICD-10-CM

## 2020-09-15 DIAGNOSIS — R6252 Short stature (child): Secondary | ICD-10-CM | POA: Diagnosis not present

## 2020-09-17 ENCOUNTER — Ambulatory Visit (INDEPENDENT_AMBULATORY_CARE_PROVIDER_SITE_OTHER): Payer: Medicaid Other | Admitting: Obstetrics and Gynecology

## 2020-09-17 ENCOUNTER — Other Ambulatory Visit: Payer: Self-pay

## 2020-09-17 ENCOUNTER — Other Ambulatory Visit (HOSPITAL_COMMUNITY)
Admission: RE | Admit: 2020-09-17 | Discharge: 2020-09-17 | Disposition: A | Discharge status: Home / Self Care | Payer: Medicaid Other | Source: Ambulatory Visit | Attending: Obstetrics and Gynecology | Admitting: Obstetrics and Gynecology

## 2020-09-17 VITALS — BP 100/70 | HR 90 | Wt 116.6 lb

## 2020-09-17 DIAGNOSIS — O219 Vomiting of pregnancy, unspecified: Secondary | ICD-10-CM

## 2020-09-17 DIAGNOSIS — Z3480 Encounter for supervision of other normal pregnancy, unspecified trimester: Secondary | ICD-10-CM | POA: Diagnosis not present

## 2020-09-17 DIAGNOSIS — A568 Sexually transmitted chlamydial infection of other sites: Secondary | ICD-10-CM | POA: Insufficient documentation

## 2020-09-17 DIAGNOSIS — O98312 Other infections with a predominantly sexual mode of transmission complicating pregnancy, second trimester: Secondary | ICD-10-CM

## 2020-09-17 DIAGNOSIS — O210 Mild hyperemesis gravidarum: Secondary | ICD-10-CM

## 2020-09-17 DIAGNOSIS — Z3A24 24 weeks gestation of pregnancy: Secondary | ICD-10-CM

## 2020-09-17 MED ORDER — ONDANSETRON 8 MG PO TBDP: 8.0000 mg | ORAL_TABLET | Freq: Three times a day (TID) | ORAL | 1 refills | Status: DC | PRN

## 2020-09-17 NOTE — Patient Instructions (Signed)
Oral Glucose Tolerance Test During Pregnancy Why am I having this test? The oral glucose tolerance test (OGTT) is done to check how your body processes blood sugar (glucose). This is one of several tests used to diagnose diabetes that develops during pregnancy (gestational diabetes mellitus). Gestational diabetes is a short-term form of diabetes that some women develop while they are pregnant. It usually occurs during the second trimester of pregnancy and goes away after delivery. Testing, or screening, for gestational diabetes usually occurs at weeks 24-28 of pregnancy. You may have the OGTT test after having a 1-hour glucose screening test if the results from that test indicate that you may have gestational diabetes. This test may also be needed if:  You have a history of gestational diabetes.  There is a history of giving birth to very large babies or of losing pregnancies (having stillbirths).  You have signs and symptoms of diabetes, such as: ? Changes in your eyesight. ? Tingling or numbness in your hands or feet. ? Changes in hunger, thirst, and urination, and these are not explained by your pregnancy. What is being tested? This test measures the amount of glucose in your blood at different times during a period of 3 hours. This shows how well your body can process glucose. What kind of sample is taken? Blood samples are required for this test. They are usually collected by inserting a needle into a blood vessel.   How do I prepare for this test?  For 3 days before your test, eat normally. Have plenty of carbohydrate-rich foods.  Follow instructions from your health care provider about: ? Eating or drinking restrictions on the day of the test. You may be asked not to eat or drink anything other than water (to fast) starting 8-10 hours before the test. ? Changing or stopping your regular medicines. Some medicines may interfere with this test. Tell a health care provider about:  All  medicines you are taking, including vitamins, herbs, eye drops, creams, and over-the-counter medicines.  Any blood disorders you have.  Any surgeries you have had.  Any medical conditions you have. What happens during the test? First, your blood glucose will be measured. This is referred to as your fasting blood glucose because you fasted before the test. Then, you will drink a glucose solution that contains a certain amount of glucose. Your blood glucose will be measured again 1, 2, and 3 hours after you drink the solution. This test takes about 3 hours to complete. You will need to stay at the testing location during this time. During the testing period:  Do not eat or drink anything other than the glucose solution.  Do not exercise.  Do not use any products that contain nicotine or tobacco, such as cigarettes, e-cigarettes, and chewing tobacco. These can affect your test results. If you need help quitting, ask your health care provider. The testing procedure may vary among health care providers and hospitals. How are the results reported? Your results will be reported as milligrams of glucose per deciliter of blood (mg/dL) or millimoles per liter (mmol/L). There is more than one source for screening and diagnosis reference values used to diagnose gestational diabetes. Your health care provider will compare your results to normal values that were established after testing a large group of people (reference values). Reference values may vary among labs and hospitals. For this test ([facility]), reference values are:  Fasting: 95 mg/dL (5.3 mmol/L).  1 hour: 180 mg/dL (10.0 mmol/L).  2 hour:   155 mg/dL (8.6 mmol/L).  3 hour: 140 mg/dL (7.8 mmol/L). What do the results mean? Results below the reference values are considered normal. If two or more of your blood glucose levels are at or above the reference values, you may be diagnosed with gestational diabetes. If only one level is  high, your health care provider may suggest repeat testing or other tests to confirm a diagnosis. Talk with your health care provider about what your results mean. Questions to ask your health care provider Ask your health care provider, or the department that is doing the test:  When will my results be ready?  How will I get my results?  What are my treatment options?  What other tests do I need?  What are my next steps? Summary  The oral glucose tolerance test (OGTT) is one of several tests used to diagnose diabetes that develops during pregnancy (gestational diabetes mellitus). Gestational diabetes is a short-term form of diabetes that some women develop while they are pregnant.  You may have the OGTT test after having a 1-hour glucose screening test if the results from that test show that you may have gestational diabetes. You may also have this test if you have any symptoms or risk factors for this type of diabetes.  Talk with your health care provider about what your results mean. This information is not intended to replace advice given to you by your health care provider. Make sure you discuss any questions you have with your health care provider. Document Revised: 10/31/2019 Document Reviewed: 10/31/2019 Elsevier Patient Education  2021 Elsevier Inc.  

## 2020-09-17 NOTE — Progress Notes (Signed)
   LOW-RISK PREGNANCY OFFICE VISIT Patient name: Deanna Cooper MRN [id number]  Date of birth: 21-Mar-2001 Chief Complaint:   Routine Prenatal Visit  History of Present Illness:   Deanna Cooper is a 20 y.o. G7P1001 female at [redacted]w[redacted]d with an Estimated Date of Delivery: 01/06/21 being seen today for ongoing management of a low-risk pregnancy.  Today she reports nausea ("needs more medicine"),pelvic pressure and inner thigh pain. Contractions: Irritability. Vag. Bleeding: None.  Movement: Present. denies leaking of fluid. Review of Systems:   Pertinent items are noted in HPI Denies abnormal vaginal discharge w/ itching/odor/irritation, headaches, visual changes, shortness of breath, chest pain, abdominal pain, severe nausea/vomiting, or problems with urination or bowel movements unless otherwise stated above. Pertinent History Reviewed:  Reviewed past medical,surgical, social, obstetrical and family history.  Reviewed problem list, medications and allergies. Physical Assessment:   Vitals:   09/17/20 1554  BP: 100/70  Pulse: 90  Weight: 116 lb 9.6 oz (52.9 kg)  Body mass index is 22.03 kg/m.        Physical Examination:   General appearance: Well appearing, and in no distress  Mental status: Alert, oriented to person, place, and time  Skin: Warm & dry  Cardiovascular: Normal heart rate noted  Respiratory: Normal respiratory effort, no distress  Abdomen: Soft, gravid, nontender  Pelvic: Cervical exam deferred         Extremities: Edema: None  Fetal Status: Fetal Heart Rate (bpm): 142 Fundal Height: 27 cm Movement: Present    No results found for this or any previous visit (from the past 24 hour(s)).  Assessment & Plan:  1) Low-risk pregnancy G2P1001 at [redacted]w[redacted]d with an Estimated Date of Delivery: 01/06/21   2) Supervision of other normal pregnancy, antepartum  - Anticipatory guidance for 2 hr GTT - advised to fast after midnight without anything to eat or drink (except for water), will have  fasting blood drawn, drink the glucola drink (flavor choices: orange, fruit punch or lemon-lime), have a visit with a provider during the first hour of testing, wait in the lab waiting room to have blood drawn at 1 hour and then 2 hours after finishing glucola drink.  3) Nausea and vomiting during pregnancy  4) Morning sickness  - Rx for ondansetron (ZOFRAN ODT) 8 MG disintegrating tablet  5) Chlamydia trachomatis infection in mother during second trimester of pregnancy  - Cervicovaginal ancillary only( [place])     Meds:  Meds ordered this encounter  Medications  . ondansetron (ZOFRAN ODT) 8 MG disintegrating tablet    Sig: Take 1 tablet (8 mg total) by mouth every 8 (eight) hours as needed for nausea or vomiting.    Dispense:  30 tablet    Refill:  1    Order Specific Question:   Supervising Provider    Answer:   Reva Bores [[id number]]   Labs/procedures today: Wet prep  Plan:  Continue routine obstetrical care   Reviewed: Preterm labor symptoms and general obstetric precautions including but not limited to vaginal bleeding, contractions, leaking of fluid and fetal movement were reviewed in detail with the patient.  All questions were answered. Has home bp cuff. Check bp weekly, let us know if >140/90.   Follow-up: Return in about 4 weeks (around 10/15/2020) for Return OB 2hr GTT.  No orders of the defined types were placed in this encounter.  Raelyn Mora MSN, CNM 09/17/2020 4:20 PM

## 2020-09-18 ENCOUNTER — Other Ambulatory Visit: Payer: Self-pay | Admitting: Obstetrics and Gynecology

## 2020-09-18 DIAGNOSIS — B9689 Other specified bacterial agents as the cause of diseases classified elsewhere: Secondary | ICD-10-CM

## 2020-09-18 LAB — CERVICOVAGINAL ANCILLARY ONLY
Bacterial Vaginitis (gardnerella): POSITIVE — AB
Candida Glabrata: NEGATIVE
Candida Vaginitis: NEGATIVE
Chlamydia: NEGATIVE
Comment: NEGATIVE
Comment: NEGATIVE
Comment: NEGATIVE
Comment: NEGATIVE
Comment: NEGATIVE
Comment: NORMAL
Neisseria Gonorrhea: NEGATIVE
Trichomonas: NEGATIVE

## 2020-09-18 MED ORDER — METRONIDAZOLE 500 MG PO TABS: 500.0000 mg | ORAL_TABLET | Freq: Two times a day (BID) | ORAL | 0 refills | Status: DC

## 2020-09-29 ENCOUNTER — Other Ambulatory Visit: Payer: Self-pay | Admitting: Obstetrics

## 2020-09-29 ENCOUNTER — Encounter: Payer: Self-pay | Admitting: *Deleted

## 2020-09-29 ENCOUNTER — Ambulatory Visit: Payer: Medicaid Other | Attending: Obstetrics

## 2020-09-29 ENCOUNTER — Ambulatory Visit: Payer: Medicaid Other | Admitting: *Deleted

## 2020-09-29 ENCOUNTER — Other Ambulatory Visit: Payer: Self-pay

## 2020-09-29 DIAGNOSIS — Z3A25 25 weeks gestation of pregnancy: Secondary | ICD-10-CM | POA: Diagnosis not present

## 2020-09-29 DIAGNOSIS — Z3480 Encounter for supervision of other normal pregnancy, unspecified trimester: Secondary | ICD-10-CM | POA: Insufficient documentation

## 2020-09-29 DIAGNOSIS — O34219 Maternal care for unspecified type scar from previous cesarean delivery: Secondary | ICD-10-CM | POA: Diagnosis not present

## 2020-09-29 DIAGNOSIS — O365920 Maternal care for other known or suspected poor fetal growth, second trimester, not applicable or unspecified: Secondary | ICD-10-CM

## 2020-09-29 DIAGNOSIS — O402XX0 Polyhydramnios, second trimester, not applicable or unspecified: Secondary | ICD-10-CM | POA: Diagnosis not present

## 2020-10-06 ENCOUNTER — Ambulatory Visit: Payer: Medicaid Other | Admitting: *Deleted

## 2020-10-06 ENCOUNTER — Encounter: Payer: Self-pay | Admitting: *Deleted

## 2020-10-06 ENCOUNTER — Ambulatory Visit: Payer: Medicaid Other | Attending: Obstetrics

## 2020-10-06 ENCOUNTER — Other Ambulatory Visit: Payer: Self-pay | Admitting: *Deleted

## 2020-10-06 ENCOUNTER — Other Ambulatory Visit: Payer: Self-pay

## 2020-10-06 DIAGNOSIS — O34219 Maternal care for unspecified type scar from previous cesarean delivery: Secondary | ICD-10-CM | POA: Diagnosis not present

## 2020-10-06 DIAGNOSIS — Z3480 Encounter for supervision of other normal pregnancy, unspecified trimester: Secondary | ICD-10-CM | POA: Diagnosis not present

## 2020-10-06 DIAGNOSIS — O365920 Maternal care for other known or suspected poor fetal growth, second trimester, not applicable or unspecified: Secondary | ICD-10-CM

## 2020-10-06 DIAGNOSIS — O365990 Maternal care for other known or suspected poor fetal growth, unspecified trimester, not applicable or unspecified: Secondary | ICD-10-CM

## 2020-10-06 DIAGNOSIS — Z3A26 26 weeks gestation of pregnancy: Secondary | ICD-10-CM | POA: Diagnosis not present

## 2020-10-06 DIAGNOSIS — O402XX0 Polyhydramnios, second trimester, not applicable or unspecified: Secondary | ICD-10-CM | POA: Diagnosis not present

## 2020-10-15 ENCOUNTER — Encounter: Payer: Self-pay | Admitting: Obstetrics and Gynecology

## 2020-10-15 ENCOUNTER — Other Ambulatory Visit: Payer: Self-pay

## 2020-10-15 ENCOUNTER — Ambulatory Visit (INDEPENDENT_AMBULATORY_CARE_PROVIDER_SITE_OTHER): Payer: Medicaid Other | Admitting: Obstetrics and Gynecology

## 2020-10-15 VITALS — BP 114/80 | HR 85 | Temp 98.4°F | Wt 118.8 lb

## 2020-10-15 DIAGNOSIS — Z3A28 28 weeks gestation of pregnancy: Secondary | ICD-10-CM

## 2020-10-15 DIAGNOSIS — Z3480 Encounter for supervision of other normal pregnancy, unspecified trimester: Secondary | ICD-10-CM | POA: Diagnosis not present

## 2020-10-15 DIAGNOSIS — Z3483 Encounter for supervision of other normal pregnancy, third trimester: Secondary | ICD-10-CM

## 2020-10-15 NOTE — Patient Instructions (Signed)
Fetal Movement Counts Patient Name: ________________________________________________ Patient Due Date: ____________________  What is a fetal movement count? A fetal movement count is the number of times that you feel your baby move during a certain amount of time. This may also be called a fetal kick count. A fetal movement count is recommended for every pregnant woman. You may be asked to start counting fetal movements as early as week 28 of your pregnancy. Pay attention to when your baby is most active. You may notice your baby's sleep and wake cycles. You may also notice things that make your baby move more. You should do a fetal movement count:  When your baby is normally most active.  At the same time each day. A good time to count movements is while you are resting, after having something to eat and drink. How do I count fetal movements? 1. Find a quiet, comfortable area. Sit, or lie down on your side. 2. Write down the date, the start time and stop time, and the number of movements that you felt between those two times. Take this information with you to your health care visits. 3. Write down your start time when you feel the first movement. 4. Count kicks, flutters, swishes, rolls, and jabs. You should feel at least 10 movements. 5. You may stop counting after you have felt 10 movements, or if you have been counting for 2 hours. Write down the stop time. 6. If you do not feel 10 movements in 2 hours, contact your health care provider for further instructions. Your health care provider may want to do additional tests to assess your baby's well-being. Contact a health care provider if:  You feel fewer than 10 movements in 2 hours.  Your baby is not moving like he or she usually does. Date: ____________ Start time: ____________ Stop time: ____________ Movements: ____________ Date: ____________ Start time: ____________ Stop time: ____________ Movements: ____________ Date: ____________  Start time: ____________ Stop time: ____________ Movements: ____________ Date: ____________ Start time: ____________ Stop time: ____________ Movements: ____________ Date: ____________ Start time: ____________ Stop time: ____________ Movements: ____________ Date: ____________ Start time: ____________ Stop time: ____________ Movements: ____________ Date: ____________ Start time: ____________ Stop time: ____________ Movements: ____________ Date: ____________ Start time: ____________ Stop time: ____________ Movements: ____________ Date: ____________ Start time: ____________ Stop time: ____________ Movements: ____________ This information is not intended to replace advice given to you by your health care provider. Make sure you discuss any questions you have with your health care provider. Document Revised: 01/10/2019 Document Reviewed: 01/10/2019 Elsevier Patient Education  2021 Elsevier Inc.  Iron-Rich Diet  Iron is a mineral that helps your body to produce hemoglobin. Hemoglobin is a protein in red blood cells that carries oxygen to your body's tissues. Eating too little iron may cause you to feel weak and tired, and it can increase your risk of infection. Iron is naturally found in many foods, and many foods have iron added to them (iron-fortified foods). You may need to follow an iron-rich diet if you do not have enough iron in your body due to certain medical conditions. The amount of iron that you need each day depends on your age, your sex, and any medical conditions you have. Follow instructions from your health care provider or a diet and nutrition specialist (dietitian) about how much iron you should eat each day. What are tips for following this plan? Reading food labels  Check food labels to see how many milligrams (mg) of iron are in  each serving. Cooking  Cook foods in pots and pans that are made from iron.  Take these steps to make it easier for your body to absorb iron from  certain foods: ? Soak beans overnight before cooking. ? Soak whole grains overnight and drain them before using. ? Ferment flours before baking, such as by using yeast in bread dough. Meal planning  When you eat foods that contain iron, you should eat them with foods that are high in vitamin C. These include oranges, peppers, tomatoes, potatoes, and mango. Vitamin C helps your body to absorb iron. General information  Take iron supplements only as told by your health care provider. An overdose of iron can be life-threatening. If you were prescribed iron supplements, take them with orange juice or a vitamin C supplement.  When you eat iron-fortified foods or take an iron supplement, you should also eat foods that naturally contain iron, such as meat, poultry, and fish. Eating naturally iron-rich foods helps your body to absorb the iron that is added to other foods or contained in a supplement.  Certain foods and drinks prevent your body from absorbing iron properly. Avoid eating these foods in the same meal as iron-rich foods or with iron supplements. These foods include: ? Coffee, black tea, and red wine. ? Milk, dairy products, and foods that are high in calcium. ? Beans and soybeans. ? Whole grains. What foods should I eat? Fruits Prunes. Raisins. Eat fruits high in vitamin C, such as oranges, grapefruits, and strawberries, alongside iron-rich foods. Vegetables Spinach (cooked). Green peas. Broccoli. Fermented vegetables. Eat vegetables high in vitamin C, such as leafy greens, potatoes, bell peppers, and tomatoes, alongside iron-rich foods. Grains Iron-fortified breakfast cereal. Iron-fortified whole-wheat bread. Enriched rice. Sprouted grains. Meats and other proteins Beef liver. Oysters. Beef. Shrimp. [place]. Chicken. Tuna. Sardines. Chickpeas. Nuts. Tofu. Pumpkin seeds. Beverages Tomato juice. Fresh orange juice. Prune juice. Hibiscus tea. Fortified instant breakfast  shakes. Sweets and desserts Blackstrap molasses. Seasonings and condiments Tahini. Fermented soy sauce. Other foods Wheat germ. The items listed above may not be a complete list of recommended foods and beverages. Contact a dietitian for more information. What foods should I avoid? Grains Whole grains. Bran cereal. Bran flour. Oats. Meats and other proteins Soybeans. Products made from soy protein. Black beans. Lentils. Mung beans. Split peas. Dairy Milk. Cream. Cheese. Yogurt. Cottage cheese. Beverages Coffee. Black tea. Red wine. Sweets and desserts Cocoa. Chocolate. Ice cream. Other foods Basil. Oregano. Large amounts of parsley. The items listed above may not be a complete list of foods and beverages to avoid. Contact a dietitian for more information. Summary  Iron is a mineral that helps your body to produce hemoglobin. Hemoglobin is a protein in red blood cells that carries oxygen to your body's tissues.  Iron is naturally found in many foods, and many foods have iron added to them (iron-fortified foods).  When you eat foods that contain iron, you should eat them with foods that are high in vitamin C. Vitamin C helps your body to absorb iron.  Certain foods and drinks prevent your body from absorbing iron properly, such as whole grains and dairy products. You should avoid eating these foods in the same meal as iron-rich foods or with iron supplements. This information is not intended to replace advice given to you by your health care provider. Make sure you discuss any questions you have with your health care provider. Document Revised: 05/05/2017 Document Reviewed: 04/18/2017 Elsevier Patient Education  2021  [employer].   Third Trimester of Pregnancy  The third trimester of pregnancy is from week 28 through week 40. This is also called months 7 through 9. This trimester is when your unborn baby (fetus) is growing very fast. At the end of the ninth month, the unborn  baby is about 20 inches long. It weighs about 6-10 pounds. Body changes during your third trimester Your body continues to go through many changes during this time. The changes vary and generally return to normal after the baby is born. Physical changes  Your weight will continue to increase. You may gain 25-35 pounds (11-16 kg) by the end of the pregnancy. If you are underweight, you may gain 28-40 lb (about 13-18 kg). If you are overweight, you may gain 15-25 lb (about 7-11 kg).  You may start to get stretch marks on your hips, belly (abdomen), and breasts.  Your breasts will continue to grow and may hurt. A yellow fluid (colostrum) may leak from your breasts. This is the first milk you are making for your baby.  You may have changes in your hair.  Your belly button may stick out.  You may have more swelling in your hands, face, or ankles. Health changes  You may have heartburn.  You may have trouble pooping (constipation).  You may get hemorrhoids. These are swollen veins in the butt that can itch or get painful.  You may have swollen veins (varicose veins) in your legs.  You may have more body aches in the pelvis, back, or thighs.  You may have more tingling or numbness in your hands, arms, and legs. The skin on your belly may also feel numb.  You may feel short of breath as your womb (uterus) gets bigger. Other changes  You may pee (urinate) more often.  You may have more problems sleeping.  You may notice the unborn baby "dropping," or moving lower in your belly.  You may have more discharge coming from your vagina.  Your joints may feel loose, and you may have pain around your pelvic bone. Follow these instructions at home: Medicines  Take over-the-counter and prescription medicines only as told by your doctor. Some medicines are not safe during pregnancy.  Take a prenatal vitamin that contains at least 600 micrograms (mcg) of folic acid. Eating and  drinking  Eat healthy meals that include: ? Fresh fruits and vegetables. ? Whole grains. ? Good sources of protein, such as meat, eggs, or tofu. ? Low-fat dairy products.  Avoid raw meat and unpasteurized juice, milk, and cheese. These carry germs that can harm you and your baby.  Eat 4 or 5 small meals rather than 3 large meals a day.  You may need to take these actions to prevent or treat trouble pooping: ? Drink enough fluids to keep your pee (urine) pale yellow. ? Eat foods that are high in fiber. These include beans, whole grains, and fresh fruits and vegetables. ? Limit foods that are high in fat and sugar. These include fried or sweet foods. Activity  Exercise only as told by your doctor. Stop exercising if you start to have cramps in your womb.  Avoid heavy lifting.  Do not exercise if it is too hot or too humid, or if you are in a place of great height (high altitude).  If you choose to, you may have sex unless your doctor tells you not to. Relieving pain and discomfort  Take breaks often, and rest with your legs  raised (elevated) if you have leg cramps or low back pain.  Take warm water baths (sitz baths) to soothe pain or discomfort caused by hemorrhoids. Use hemorrhoid cream if your doctor approves.  Wear a good support bra if your breasts are tender.  If you develop bulging, swollen veins in your legs: ? Wear support hose as told by your doctor. ? Raise your feet for 15 minutes, 3-4 times a day. ? Limit salt in your food. Safety  Talk to your doctor before traveling far distances.  Do not use hot tubs, steam rooms, or saunas.  Wear your seat belt at all times when you are in a car.  Talk with your doctor if someone is hurting you or yelling at you a lot. Preparing for your baby's arrival To prepare for the arrival of your baby:  Take prenatal classes.  Visit the hospital and tour the maternity area.  Buy a rear-facing car seat. Learn how to install it  in your car.  Prepare the baby's room. Take out all pillows and stuffed animals from the baby's crib. General instructions  Avoid cat litter boxes and soil used by cats. These carry germs that can cause harm to the baby and can cause a loss of your baby by miscarriage or stillbirth.  Do not douche or use tampons. Do not use scented sanitary pads.  Do not smoke or use any products that contain nicotine or tobacco. If you need help quitting, ask your doctor.  Do not drink alcohol.  Do not use herbal medicines, illegal drugs, or medicines that were not approved by your doctor. Chemicals in these products can affect your baby.  Keep all follow-up visits. This is important. Where to find more information  American Pregnancy Association: americanpregnancy.org  [employer]: www.acog.org  Office on Women's Health: [employer].nz Contact a doctor if:  You have a fever.  You have mild cramps or pressure in your lower belly.  You have a nagging pain in your belly area.  You vomit, or you have watery poop (diarrhea).  You have bad-smelling fluid coming from your vagina.  You have pain when you pee, or your pee smells bad.  You have a headache that does not go away when you take medicine.  You have changes in how you see, or you see spots in front of your eyes. Get help right away if:  Your water breaks.  You have regular contractions that are less than 5 minutes apart.  You are spotting or bleeding from your vagina.  You have very bad belly cramps or pain.  You have trouble breathing.  You have chest pain.  You faint.  You have not felt the baby move for the amount of time told by your doctor.  You have new or increased pain, swelling, or redness in an arm or leg. Summary  The third trimester is from week 28 through week 40 (months 7 through 9). This is the time when your unborn baby is growing very fast.  During  this time, your discomfort may increase as you gain weight and as your baby grows.  Get ready for your baby to arrive by taking prenatal classes, buying a rear-facing car seat, and preparing the baby's room.  Get help right away if you are bleeding from your vagina, you have chest pain and trouble breathing, or you have not felt the baby move for the amount of time told by your doctor. This information is not  intended to replace advice given to you by your health care provider. Make sure you discuss any questions you have with your health care provider. Document Revised: 10/30/2019 Document Reviewed: 09/05/2019 Elsevier Patient Education  2021 [employer].

## 2020-10-15 NOTE — Progress Notes (Signed)
   LOW-RISK PREGNANCY OFFICE VISIT Patient name: Deanna Cooper MRN [id number]  Date of birth: 14-Sep-2000 Chief Complaint:   Routine Prenatal Visit  History of Present Illness:   Deanna Cooper is a 20 y.o. G70P1001 female at [redacted]w[redacted]d with an Estimated Date of Delivery: 01/06/21 being seen today for ongoing management of a low-risk pregnancy.  Today she reports pelvic pressure. Contractions: Irritability. Vag. Bleeding: None.  Movement: Present. denies leaking of fluid. Review of Systems:   Pertinent items are noted in HPI Denies abnormal vaginal discharge w/ itching/odor/irritation, headaches, visual changes, shortness of breath, chest pain, abdominal pain, severe nausea/vomiting, or problems with urination or bowel movements unless otherwise stated above. Pertinent History Reviewed:  Reviewed past medical,surgical, social, obstetrical and family history.  Reviewed problem list, medications and allergies. Physical Assessment:   Vitals:   10/15/20 0801  BP: 114/80  Pulse: 85  Temp: 98.4 F (36.9 C)  Weight: 118 lb 12.8 oz (53.9 kg)  Body mass index is 22.45 kg/m.        Physical Examination:   General appearance: Well appearing, and in no distress  Mental status: Alert, oriented to person, place, and time  Skin: Warm & dry  Cardiovascular: Normal heart rate noted  Respiratory: Normal respiratory effort, no distress  Abdomen: Soft, gravid, nontender  Pelvic: Cervical exam deferred         Extremities: Edema: None  Fetal Status: Fetal Heart Rate (bpm): 159 Fundal Height: 29 cm Movement: Present    No results found for this or any previous visit (from the past 24 hour(s)).  Assessment & Plan:  1) Low-risk pregnancy G2P1001 at [redacted]w[redacted]d with an Estimated Date of Delivery: 01/06/21   2) Supervision of other normal pregnancy, antepartum  - Glucose Tolerance, 2 Hours w/1 Hour,  - HIV Antibody (routine testing w rflx),  - RPR,  - CBC  3) [redacted] weeks gestation of pregnancy    Meds: No orders of  the defined types were placed in this encounter.  Labs/procedures today: 2 hr GTT and 3rd trimester labs  Plan:  Continue routine obstetrical care   Reviewed: Preterm labor symptoms and general obstetric precautions including but not limited to vaginal bleeding, contractions, leaking of fluid and fetal movement were reviewed in detail with the patient.  All questions were answered.   Follow-up: Return in about 4 weeks (around 11/12/2020) for Return OB visit.  Orders Placed This Encounter  Procedures  . Glucose Tolerance, 2 Hours w/1 Hour  . HIV Antibody (routine testing w rflx)  . RPR  . CBC   Raelyn Mora MSN, CNM 10/15/2020 8:26 AM

## 2020-10-20 LAB — CBC
Hematocrit: 32.4 % — ABNORMAL LOW (ref 34.0–46.6)
Hemoglobin: 10.8 g/dL — ABNORMAL LOW (ref 11.1–15.9)
MCH: 31.1 pg (ref 26.6–33.0)
MCHC: 33.3 g/dL (ref 31.5–35.7)
MCV: 93 fL (ref 79–97)
Platelets: 335 10*3/uL (ref 150–450)
RBC: 3.47 x10E6/uL — ABNORMAL LOW (ref 3.77–5.28)
RDW: 12.2 % (ref 11.7–15.4)
WBC: 9.4 10*3/uL (ref 3.4–10.8)

## 2020-10-20 LAB — GLUCOSE TOLERANCE, 2 HOURS W/ 1HR
Glucose, 1 hour: 144 mg/dL (ref 65–179)
Glucose, 2 hour: 75 mg/dL (ref 65–152)
Glucose, Fasting: 73 mg/dL (ref 65–91)

## 2020-10-20 LAB — RPR: RPR Ser Ql: NONREACTIVE

## 2020-10-20 LAB — HIV ANTIBODY (ROUTINE TESTING W REFLEX): HIV Screen 4th Generation wRfx: NONREACTIVE

## 2020-10-27 ENCOUNTER — Ambulatory Visit: Payer: Medicaid Other

## 2020-10-30 ENCOUNTER — Encounter (HOSPITAL_COMMUNITY): Payer: Self-pay | Admitting: Family Medicine

## 2020-10-30 ENCOUNTER — Inpatient Hospital Stay (HOSPITAL_COMMUNITY)
Admission: AD | Admit: 2020-10-30 | Discharge: 2020-10-31 | Disposition: A | Discharge status: Home / Self Care | Payer: Medicaid Other | Attending: Family Medicine | Admitting: Family Medicine

## 2020-10-30 ENCOUNTER — Other Ambulatory Visit: Payer: Self-pay

## 2020-10-30 DIAGNOSIS — R109 Unspecified abdominal pain: Secondary | ICD-10-CM | POA: Diagnosis not present

## 2020-10-30 DIAGNOSIS — Z79899 Other long term (current) drug therapy: Secondary | ICD-10-CM | POA: Insufficient documentation

## 2020-10-30 DIAGNOSIS — R1030 Lower abdominal pain, unspecified: Secondary | ICD-10-CM | POA: Diagnosis not present

## 2020-10-30 DIAGNOSIS — Z3A30 30 weeks gestation of pregnancy: Secondary | ICD-10-CM | POA: Insufficient documentation

## 2020-10-30 DIAGNOSIS — O26893 Other specified pregnancy related conditions, third trimester: Secondary | ICD-10-CM

## 2020-10-30 DIAGNOSIS — O26899 Other specified pregnancy related conditions, unspecified trimester: Secondary | ICD-10-CM

## 2020-10-30 LAB — URINALYSIS, ROUTINE W REFLEX MICROSCOPIC
Bilirubin Urine: NEGATIVE
Glucose, UA: NEGATIVE mg/dL
Hgb urine dipstick: NEGATIVE
Ketones, ur: 5 mg/dL — AB
Nitrite: NEGATIVE
Protein, ur: 30 mg/dL — AB
Specific Gravity, Urine: 1.019 (ref 1.005–1.030)
pH: 9 — ABNORMAL HIGH (ref 5.0–8.0)

## 2020-10-30 MED ORDER — CYCLOBENZAPRINE HCL 5 MG PO TABS
5.0000 mg | ORAL_TABLET | Freq: Once | ORAL | Status: AC
  Administered 2020-10-31: 5 mg via ORAL
  Filled 2020-10-30: qty 1

## 2020-10-30 MED ORDER — ACETAMINOPHEN 500 MG PO TABS
1000.0000 mg | ORAL_TABLET | Freq: Once | ORAL | Status: AC
  Administered 2020-10-30: 1000 mg via ORAL
  Filled 2020-10-30: qty 2

## 2020-10-30 NOTE — MAU Provider Note (Signed)
Chief Complaint:  Abdominal Pain   Event Date/Time   First Provider Initiated Contact with Patient 10/30/20 2317    HPI: Rakhi Romagnoli is a 20 y.o. G2P1001 at [redacted]w[redacted]d by L/7 who presents to maternity admissions reporting concern of severe abdominal pain. Pt reports that she was picking up a chair that her two-year-old daughter had thrown when she developed sudden onset bilateral, lower abdominal pain. On arrival, pt reports that pain was initially 10/10 but now slightly improved without any medications prior to arrival. The pain is constant but has periods of worsening. Lying still seems to slightly improve her symptoms. Pt denies any substance use or abdominal trauma. No known sick contacts. No reported safety concerns.  She reports good fetal movement, denies LOF, vaginal bleeding, vaginal itching/burning, urinary symptoms, h/a, dizziness, n/v, or fever/chills.    Past Medical History: Past Medical History:  Diagnosis Date  . Chlamydia infection affecting pregnancy in first trimester, antepartum 05/19/2017   Dx 04/26/17 > treated TOC 06/02/17 > negative  . Hyperemesis affecting pregnancy, antepartum   . Learning disability    Dx age 69 yo  . Nexplanon insertion 11/07/2017  . Positive GBS test 10/24/2017  . Supervision of normal first pregnancy, antepartum 04/21/2017    Clinic  [facility] Dating  11 wk ultrasound Blood type: AB/Positive/-- (11/16 1442)  Genetic Screen 1 Screen:    AFP:     Quad:     NIPS:obtained 06/02/17 > negative (female) Antibody:Negative (11/16 1442) Anatomic [place]  Normal Rubella: 8.71 (11/16 1442) GTT Early:               Third trimester: 69-113-89 RPR: Non Reactive (03/22 1542)  Flu vaccine  07/14/17 HBsAg: Negative (11/16 144  . Supervision of normal first teen pregnancy, unspecified trimester 04/21/2017   Refer to [facility] > enrolled, class every Thursday; Volunteer Doula    Past obstetric history: OB History  Gravida Para Term Preterm AB Living  2  1 1     1   SAB IAB Ectopic Multiple Live Births        0 1    # Outcome Date GA Lbr Len/2nd Weight Sex Delivery Anes PTL Lv  2 Current           1 Term 11/05/17 [redacted]w[redacted]d  2975 g F CS-LTranv Spinal  LIV    Past Surgical History: Past Surgical History:  Procedure Laterality Date  . CESAREAN SECTION N/A 11/05/2017   Procedure: CESAREAN SECTION;  Surgeon: Reva Bores, MD;  Location: [facility] BIRTHING SUITES;  Service: Obstetrics;  Laterality: N/A;    Family History: Family History  Problem Relation Age of Onset  . Diabetes Mother   . Hypertension Mother   . Hyperlipidemia Mother   . Thalassemia Brother     Social History: Social History   Tobacco Use  . Smoking status: Never Smoker  . Smokeless tobacco: Never Used  Vaping Use  . Vaping Use: Never used  Substance Use Topics  . Alcohol use: No  . Drug use: No    Allergies: No Known Allergies  Meds:  Medications Prior to Admission  Medication Sig Dispense Refill Last Dose  . ondansetron (ZOFRAN ODT) 8 MG disintegrating tablet Take 1 tablet (8 mg total) by mouth every 8 (eight) hours as needed for nausea or vomiting. 30 tablet 1 10/30/2020 at Unknown time  . Blood Pressure Monitoring (BLOOD PRESSURE MONITOR AUTOMAT) DEVI 1 Device by Does not apply route daily. Automatic blood pressure cuff regular  size. To monitor blood pressure regularly at home. ICD-10 code:Z34.90 1 each 0   . diphenhydrAMINE (BENADRYL) 25 MG tablet Take 25 mg by mouth at bedtime as needed for sleep. (Patient not taking: No sig reported)     . doxylamine, Sleep, (UNISOM) 25 MG tablet Take 1 tablet (25 mg total) by mouth at bedtime as needed. (Patient not taking: No sig reported) 30 tablet 0   . ferrous sulfate (FERROUSUL) 325 (65 FE) MG tablet Take 1 tablet (325 mg total) by mouth 2 (two) times daily. (Patient not taking: No sig reported) 60 tablet 1   . loratadine (CLARITIN) 10 MG tablet Take 1 tablet (10 mg total) by mouth daily. 30 tablet 5   . metroNIDAZOLE  (FLAGYL) 500 MG tablet Take 1 tablet (500 mg total) by mouth 2 (two) times daily. Take with Food. 14 tablet 0   . Misc. Devices (GOJJI WEIGHT SCALE) MISC 1 Device by Does not apply route daily as needed. To weight self daily as needed at home. ICD-10 code: Z34.90 (Patient not taking: No sig reported) 1 each 0   . Prenatal Vit-Fe Fumarate-FA (PRENATAL MULTIVITAMIN) TABS tablet Take 1 tablet by mouth daily at 12 noon.     . promethazine (PHENERGAN) 25 MG tablet Take 1 tablet (25 mg total) by mouth every 6 (six) hours as needed for nausea or vomiting. 30 tablet 2   . ranitidine (ZANTAC) 150 MG capsule Take 1 capsule (150 mg total) by mouth daily. 30 capsule 0   . scopolamine (TRANSDERM-SCOP) 1 MG/3DAYS Place 1 patch (1.5 mg total) onto the skin every 3 (three) days. (Patient not taking: No sig reported) 10 patch 12   . terconazole (TERAZOL 7) 0.4 % vaginal cream Place 1 applicator vaginally at bedtime. Use for seven days (Patient not taking: No sig reported) 45 g 0   . [DISCONTINUED] acetaminophen (TYLENOL) 500 MG tablet Take 500 mg by mouth every 6 (six) hours as needed for mild pain or headache. (Patient not taking: No sig reported)       ROS:  Review of Systems  Constitutional: Negative for chills and fever.  HENT: Negative for congestion and sore throat.   Eyes: Negative for photophobia and visual disturbance.  Respiratory: Negative for cough, chest tightness and shortness of breath.   Cardiovascular: Negative for chest pain.  Gastrointestinal: Positive for abdominal pain and nausea. Negative for vomiting.  Genitourinary: Positive for vaginal discharge. Negative for dysuria, flank pain, frequency, pelvic pain, vaginal bleeding and vaginal pain.  Musculoskeletal: Negative for myalgias and neck pain.  Neurological: Negative for seizures and headaches.   I have reviewed patient's Past Medical Hx, Surgical Hx, Family Hx, Social Hx, medications and allergies.   Physical Exam   Patient Vitals  for the past 24 hrs:  BP Temp Temp src Pulse Resp SpO2  10/31/20 0324 (!) 99/47 -- -- 89 -- 99 %  10/30/20 2313 (!) 118/59 98.3 F (36.8 C) Oral (!) 102 18 100 %   Constitutional: Well-developed, well-nourished female in no acute distress. Holding belly and grimacing on arrival but intermittently resting comfortably with eyes closed. Cardiovascular: normal rate Respiratory: normal effort GI: Abd soft, non-distended, gravid appropriate for gestational age. Moderate tenderness along suprapubic region and bilateral inguinal area. MS: Extremities nontender, no edema, normal ROM Neurologic: Alert and oriented x 4.  GU:  Bimanual exam: 1/50/ballotable (unchanged s/p 2 hours)  FHT:  Baseline 130, moderate variability, accelerations present, no decelerations Contractions: irregular   Labs: Results for orders  placed or performed during the hospital encounter of 10/30/20 (from the past 24 hour(s))  Urinalysis, Routine w reflex microscopic Urine, Clean Catch     Status: Abnormal   Collection Time: 10/30/20 10:49 PM  Result Value Ref Range   Color, Urine YELLOW YELLOW   APPearance HAZY (A) CLEAR   Specific Gravity, Urine 1.019 1.005 - 1.030   pH 9.0 (H) 5.0 - 8.0   Glucose, UA NEGATIVE NEGATIVE mg/dL   Hgb urine dipstick NEGATIVE NEGATIVE   Bilirubin Urine NEGATIVE NEGATIVE   Ketones, ur 5 (A) NEGATIVE mg/dL   Protein, ur 30 (A) NEGATIVE mg/dL   Nitrite NEGATIVE NEGATIVE   Leukocytes,Ua SMALL (A) NEGATIVE   RBC / HPF 0-5 0 - 5 RBC/hpf   WBC, UA 6-10 0 - 5 WBC/hpf   Bacteria, UA RARE (A) NONE SEEN   Squamous Epithelial / LPF 6-10 0 - 5   Mucus PRESENT   Wet prep, genital     Status: Abnormal   Collection Time: 10/30/20 11:45 PM   Specimen: PATH Cytology Cervicovaginal Ancillary Only  Result Value Ref Range   Yeast Wet Prep HPF POC NONE SEEN NONE SEEN   Trich, Wet Prep NONE SEEN NONE SEEN   Clue Cells Wet Prep HPF POC PRESENT (A) NONE SEEN   WBC, Wet Prep HPF POC MANY (A) NONE SEEN    Sperm NONE SEEN   CBC     Status: Abnormal   Collection Time: 10/31/20  1:41 AM  Result Value Ref Range   WBC 9.5 4.0 - 10.5 K/uL   RBC 3.24 (L) 3.87 - 5.11 MIL/uL   Hemoglobin 10.1 (L) 12.0 - 15.0 g/dL   HCT 96.2 (L) 95.2 - 84.1 %   MCV 92.6 80.0 - 100.0 fL   MCH 31.2 26.0 - 34.0 pg   MCHC 33.7 30.0 - 36.0 g/dL   RDW 32.4 40.1 - 02.7 %   Platelets 357 150 - 400 K/uL   nRBC 0.0 0.0 - 0.2 %  Comprehensive metabolic panel     Status: Abnormal   Collection Time: 10/31/20  1:41 AM  Result Value Ref Range   Sodium 133 (L) 135 - 145 mmol/L   Potassium 3.8 3.5 - 5.1 mmol/L   Chloride 103 98 - 111 mmol/L   CO2 25 22 - 32 mmol/L   Glucose, Bld 93 70 - 99 mg/dL   BUN <5 (L) 6 - 20 mg/dL   Creatinine, Ser 2.53 0.44 - 1.00 mg/dL   Calcium 8.9 8.9 - 66.4 mg/dL   Total Protein 6.4 (L) 6.5 - 8.1 g/dL   Albumin 3.0 (L) 3.5 - 5.0 g/dL   AST 19 15 - 41 U/L   ALT 10 0 - 44 U/L   Alkaline Phosphatase 88 38 - 126 U/L   Total Bilirubin 0.6 0.3 - 1.2 mg/dL   GFR, Estimated >40 >34 mL/min   Anion gap 5 5 - 15  Urine rapid drug screen (hosp performed)     Status: Abnormal   Collection Time: 10/31/20  2:32 AM  Result Value Ref Range   Opiates NONE DETECTED NONE DETECTED   Cocaine NONE DETECTED NONE DETECTED   Benzodiazepines NONE DETECTED NONE DETECTED   Amphetamines NONE DETECTED NONE DETECTED   Tetrahydrocannabinol POSITIVE (A) NONE DETECTED   Barbiturates NONE DETECTED NONE DETECTED   AB/Positive/-- (01/18 1041)  Imaging:  [place] MFM OB FOLLOW UP  Result Date: 10/06/2020 ----------------------------------------------------------------------  OBSTETRICS REPORT                       (  Signed Final 10/06/2020 04:43 pm) ---------------------------------------------------------------------- Patient Info  ID #:       [id number]                          D.O.B.:  30-Oct-2000 (19 yrs)  Name:       [name]                    Visit Date: 10/06/2020 03:31 pm  ---------------------------------------------------------------------- Performed By  Attending:        Noralee Space MD        Referred By:      [facility]  Performed By:     Hurman Horn          [facility] ---------------------------------------------------------------------- Orders  #  Description                           Code        Ordered By  1  [place] MFM OB FOLLOW UP                   76816.01    YU FANG  2  [place] MFM UA CORD DOPPLER                [id number]    YU FANG ----------------------------------------------------------------------  #  Order #                     Accession #                Episode #  1  [id number]                   [id number]                 [id number]  2  [id number]                   [id number]                 [id number] ---------------------------------------------------------------------- Indications  [redacted] weeks gestation of pregnancy                Z3A.26  Maternal care for known or suspected poor      O36.5921  fetal growth, second trimester, fetus 1 IUGR  Polyhydramnios, second trimester,              O40.2XX0  antepartum condition or complication, fetus  unspecified  Small for gestational age fetus affecting      4O36.5990  management of mother  Previous cesarean delivery, antepartum         O34.219 ---------------------------------------------------------------------- Fetal Evaluation  Num Of Fetuses:         1  Fetal Heart Rate(bpm):  155  Cardiac Activity:       Observed  Presentation:           Cephalic  Placenta:               Fundal  P. Cord Insertion:      Visualized, central  Amniotic Fluid  AFI FV:      Within normal limits                              Largest Pocket(cm)                              7.34  ---------------------------------------------------------------------- Biometry  BPD:        66  mm     G. Age:  26w 4d         31  %    CI:        74.56   %    70 - 86                                                          FL/HC:      19.2   %    18.6 - 20.4  HC:      242.6  mm     G. Age:  26w 2d         11  %    HC/AC:      1.13        1.05 - 1.21  AC:      214.7  mm     G. Age:  26w 0d         17  %    FL/BPD:     70.6   %    71 - 87  FL:       46.6  mm     G. Age:  25w 4d          7  %    FL/AC:      21.7   %    20 - 24  HUM:        45  mm     G. Age:  26w 5d         42  %  LV:        5.1  mm  Est. FW:     864  gm    1 lb 14 oz      10  % ---------------------------------------------------------------------- OB History  Blood Type:   AB+  Gravidity:    2         Term:   1        Prem:   0        SAB:   0  TOP:          0       Ectopic:  0        Living: 1 ---------------------------------------------------------------------- Gestational Age  LMP:           26w 6d        Date:  04/01/20                 EDD:   01/06/21  U/S Today:     26w 1d                                        EDD:   01/11/21  Best:          26w 6d     Det. By:  LMP  (04/01/20)          EDD:   01/06/21 ---------------------------------------------------------------------- Anatomy  Cranium:               Appears normal         Aortic Arch:            Previously seen  Cavum:                 Appears normal         Ductal Arch:            Previously seen  Ventricles:            Appears normal         Diaphragm:              Appears normal  Choroid Plexus:        Previously seen        Stomach:                Appears normal, left                                                                        sided  Cerebellum:            Previously seen        Abdomen:                Appears normal  Posterior Fossa:       Previously seen        Abdominal Wall:         Previously seen  Nuchal Fold:           Previously seen        Cord Vessels:            Previously seen  Face:                  Profile appears        Kidneys:                Appear normal                         normal  Lips:                  Previously seen  Bladder:                Appears normal  Thoracic:              Previously seen        Spine:                  Previously seen  Heart:                 Appears normal         Upper Extremities:      Previously seen                         (4CH, axis, and                         situs)  RVOT:                  Appears normal         Lower Extremities:      Previously seen  LVOT:                  Appears normal  Other:  Fetus appears to be female. ---------------------------------------------------------------------- Doppler - Fetal Vessels  Umbilical Artery   S/D     %tile      RI    %tile      PI    %tile     PSV    ADFV    RDFV                                                     (cm/s)   3.88       82    0.74       81    1.14       70    30.62      No      No ---------------------------------------------------------------------- Impression  Fetal growth restriction.  Patient return for fetal growth  assessment.  On today's ultrasound, amniotic fluid is normal and good fetal  activity seen.  The estimated fetal weight and abdominal  circumference measurements are at the 10th percentile.  Umbilical artery Doppler showed normal forward diastolic flow  We reassured the patient of the findings.  Blood pressure  today at her office is 117/60 mmHg. ---------------------------------------------------------------------- Recommendations  -An appointment was made for her to return in 3 weeks for  fetal growth assessment. ----------------------------------------------------------------------                  Noralee Space, MD Electronically Signed Final Report   10/06/2020 04:43 pm ----------------------------------------------------------------------  [place] MFM UA CORD DOPPLER  Result Date:  10/06/2020 ----------------------------------------------------------------------  OBSTETRICS REPORT                       (Signed Final 10/06/2020 04:43 pm) ---------------------------------------------------------------------- Patient Info  ID #:       [id number]                          D.O.B.:  [birth year] (19 yrs)  Name:       [name]  Visit Date: 10/06/2020 03:31 pm ---------------------------------------------------------------------- Performed By  Attending:        Noralee Space MD        Referred By:      [facility]  Performed By:     Hurman Horn          [facility]                                     [facility] ---------------------------------------------------------------------- Orders  #  Description                           Code        Ordered By  1  [place] MFM OB FOLLOW UP                   76816.01    YU FANG  2  [place] MFM UA CORD DOPPLER                76820.02    YU FANG ----------------------------------------------------------------------  #  Order #                     Accession #                Episode #  1  [id number]                   [id number]                 [id number]  2  [id number]                   [id number]                 [id number] ---------------------------------------------------------------------- Indications  [redacted] weeks gestation of pregnancy                Z3A.26  Maternal care for known or suspected poor      O36.5921  fetal growth, second trimester, fetus 1 IUGR  Polyhydramnios, second trimester,              O40.2XX0  antepartum condition or complication, fetus  unspecified  Small for gestational age fetus affecting      O36.5990  management of mother  Previous cesarean delivery, antepartum         O34.219  ---------------------------------------------------------------------- Fetal Evaluation  Num Of Fetuses:         1  Fetal Heart Rate(bpm):  155  Cardiac Activity:       Observed  Presentation:           Cephalic  Placenta:               Fundal  P. Cord Insertion:      Visualized, central  Amniotic Fluid  AFI FV:      Within normal limits                              Largest Pocket(cm)                              7.34 ---------------------------------------------------------------------- Biometry  BPD:        66  mm     G. Age:  26w 4d         31  %    CI:        74.56   %    70 - 86                                                          FL/HC:      19.2   %    18.6 - 20.4  HC:      242.6  mm     G. Age:  26w 2d         11  %    HC/AC:      1.13        1.05 - 1.21  AC:      214.7  mm     G. Age:  26w 0d         17  %    FL/BPD:     70.6   %    71 - 87  FL:       46.6  mm     G. Age:  25w 4d          7  %    FL/AC:      21.7   %    20 - 24  HUM:        45  mm     G. Age:  26w 5d         42  %  LV:        5.1  mm  Est. FW:     864  gm    1 lb 14 oz      10  % ---------------------------------------------------------------------- OB History  Blood Type:   AB+  Gravidity:    2         Term:   1        Prem:   0        SAB:   0  TOP:          0       Ectopic:  0        Living: 1 ---------------------------------------------------------------------- Gestational Age  LMP:           26w 6d        Date:  04/01/20                 EDD:   01/06/21  U/S Today:     26w 1d                                        EDD:   01/11/21  Best:          26w 6d     Det. By:  LMP  (04/01/20)          EDD:   01/06/21 ---------------------------------------------------------------------- Anatomy  Cranium:               Appears normal         Aortic Arch:            Previously seen  Cavum:                 Appears normal         Ductal Arch:            Previously seen  Ventricles:            Appears normal         Diaphragm:              Appears  normal  Choroid Plexus:        Previously seen        Stomach:                Appears normal, left                                                                        sided  Cerebellum:            Previously seen        Abdomen:                Appears normal  Posterior Fossa:       Previously seen        Abdominal Wall:         Previously seen  Nuchal Fold:           Previously seen        Cord Vessels:           Previously seen  Face:                  Profile appears        Kidneys:                Appear normal                         normal  Lips:                  Previously seen        Bladder:                Appears normal  Thoracic:              Previously seen        Spine:                  Previously seen  Heart:  Appears normal         Upper Extremities:      Previously seen                         (4CH, axis, and                         situs)  RVOT:                  Appears normal         Lower Extremities:      Previously seen  LVOT:                  Appears normal  Other:  Fetus appears to be female. ---------------------------------------------------------------------- Doppler - Fetal Vessels  Umbilical Artery   S/D     %tile      RI    %tile      PI    %tile     PSV    ADFV    RDFV                                                     (cm/s)   3.88       82    0.74       81    1.14       70    30.62      No      No ---------------------------------------------------------------------- Impression  Fetal growth restriction.  Patient return for fetal growth  assessment.  On today's ultrasound, amniotic fluid is normal and good fetal  activity seen.  The estimated fetal weight and abdominal  circumference measurements are at the 10th percentile.  Umbilical artery Doppler showed normal forward diastolic flow  We reassured the patient of the findings.  Blood pressure  today at her office is 117/60 mmHg. ---------------------------------------------------------------------- Recommendations   -An appointment was made for her to return in 3 weeks for  fetal growth assessment. ----------------------------------------------------------------------                  Noralee Space, MD Electronically Signed Final Report   10/06/2020 04:43 pm ----------------------------------------------------------------------   MAU Course/MDM: Orders Placed This Encounter  Procedures  . Wet prep, genital  . [place] MFM OB LIMITED  . Urinalysis, Routine w reflex microscopic Urine, Clean Catch  . CBC  . Comprehensive metabolic panel  . Urine rapid drug screen (hosp performed)  . Discharge patient Discharge disposition: 01-Home or Self Care; Discharge patient date: 10/31/2020    Meds ordered this encounter  Medications  . acetaminophen (TYLENOL) tablet 1,000 mg  . cyclobenzaprine (FLEXERIL) tablet 5 mg  . acetaminophen (TYLENOL) 500 MG tablet    Sig: Take 2 tablets (1,000 mg total) by mouth every 8 (eight) hours as needed for mild pain, moderate pain or headache.    Dispense:  30 tablet    Refill:  0     NST reviewed and reactive Treatments in MAU included: tylenol, flexeril. Pt discharge with strict return precautions for concern of vaginal bleeding, worsening or persistent abdominal pain, decrease fetal movement, leakage of fluid or other concerns.  Assessment & Plan: Toyoko Silos is a 20 y.o. G2P1001 at [redacted]w[redacted]d by L/7 who presents to maternity admissions reporting concern  of severe abdominal pain.  1. Abdominal pain during pregnancy in third trimester   No sign of abruption on limited ultrasound. No sign of preterm labor given unchanged cervical exam s/p 2 hours of observation. Reassuringly, pt with unremarkable labs except for Hgb 10.1 (currently on po iron). Pt able to tolerate po intake during stay in MAU. Abdominal pain most likely related to round ligament pain and normal pregnancy. Recommended good hydration with use of tylenol as needed in addition to belly band for additional support. Provided  strict return precautions with plan to f/u as noted below.  Discharge home; plan for f/u on 6/6 with MFM as previously scheduled. Next prenatal appointment scheduled for 6/9 with Raelyn Mora, CNM. Labor precautions and fetal kick counts  Allergies as of 10/31/2020   No Known Allergies     Medication List    STOP taking these medications   terconazole 0.4 % vaginal cream Commonly known as: TERAZOL 7     TAKE these medications   acetaminophen 500 MG tablet Commonly known as: TYLENOL Take 2 tablets (1,000 mg total) by mouth every 8 (eight) hours as needed for mild pain, moderate pain or headache. What changed:   how much to take  when to take this  reasons to take this   Blood Pressure Monitor Automat Devi 1 Device by Does not apply route daily. Automatic blood pressure cuff regular size. To monitor blood pressure regularly at home. ICD-10 code:Z34.90   diphenhydrAMINE 25 MG tablet Commonly known as: BENADRYL Take 25 mg by mouth at bedtime as needed for sleep.   doxylamine (Sleep) 25 MG tablet Commonly known as: UNISOM Take 1 tablet (25 mg total) by mouth at bedtime as needed.   ferrous sulfate 325 (65 FE) MG tablet Commonly known as: FerrouSul Take 1 tablet (325 mg total) by mouth 2 (two) times daily.   Gojji Weight Scale Misc 1 Device by Does not apply route daily as needed. To weight self daily as needed at home. ICD-10 code: Z34.90   loratadine 10 MG tablet Commonly known as: Claritin Take 1 tablet (10 mg total) by mouth daily.   metroNIDAZOLE 500 MG tablet Commonly known as: FLAGYL Take 1 tablet (500 mg total) by mouth 2 (two) times daily. Take with Food.   ondansetron 8 MG disintegrating tablet Commonly known as: Zofran ODT Take 1 tablet (8 mg total) by mouth every 8 (eight) hours as needed for nausea or vomiting.   prenatal multivitamin Tabs tablet Take 1 tablet by mouth daily at 12 noon.   promethazine 25 MG tablet Commonly known as:  PHENERGAN Take 1 tablet (25 mg total) by mouth every 6 (six) hours as needed for nausea or vomiting.   ranitidine 150 MG capsule Commonly known as: ZANTAC Take 1 capsule (150 mg total) by mouth daily.   scopolamine 1 MG/3DAYS Commonly known as: TRANSDERM-SCOP Place 1 patch (1.5 mg total) onto the skin every 3 (three) days.      Sheila Oats, MD OB Fellow, [facility] 10/31/2020 3:47 AM

## 2020-10-30 NOTE — MAU Note (Signed)
.  Deanna Cooper is a 20 y.o. at [redacted]w[redacted]d here in MAU reporting: lower abdominal pain that radiates down her legs. The pain began when she leaned down to pick up her daughter. Denies vaginal bleeding or leaking of fluid. +FM.   Pain score: 10/10 * Vitals:   10/30/20 2313  BP: (!) 118/59  Pulse: (!) 102  Resp: 18  Temp: 98.3 F (36.8 C)  SpO2: 100%

## 2020-10-31 ENCOUNTER — Inpatient Hospital Stay (HOSPITAL_BASED_OUTPATIENT_CLINIC_OR_DEPARTMENT_OTHER): Payer: Medicaid Other

## 2020-10-31 DIAGNOSIS — O26893 Other specified pregnancy related conditions, third trimester: Secondary | ICD-10-CM

## 2020-10-31 DIAGNOSIS — R109 Unspecified abdominal pain: Secondary | ICD-10-CM | POA: Diagnosis not present

## 2020-10-31 DIAGNOSIS — Z3A30 30 weeks gestation of pregnancy: Secondary | ICD-10-CM

## 2020-10-31 LAB — WET PREP, GENITAL
Sperm: NONE SEEN
Trich, Wet Prep: NONE SEEN
Yeast Wet Prep HPF POC: NONE SEEN

## 2020-10-31 LAB — CBC
HCT: 30 % — ABNORMAL LOW (ref 36.0–46.0)
Hemoglobin: 10.1 g/dL — ABNORMAL LOW (ref 12.0–15.0)
MCH: 31.2 pg (ref 26.0–34.0)
MCHC: 33.7 g/dL (ref 30.0–36.0)
MCV: 92.6 fL (ref 80.0–100.0)
Platelets: 357 10*3/uL (ref 150–400)
RBC: 3.24 MIL/uL — ABNORMAL LOW (ref 3.87–5.11)
RDW: 12.4 % (ref 11.5–15.5)
WBC: 9.5 10*3/uL (ref 4.0–10.5)
nRBC: 0 % (ref 0.0–0.2)

## 2020-10-31 LAB — COMPREHENSIVE METABOLIC PANEL
ALT: 10 U/L (ref 0–44)
AST: 19 U/L (ref 15–41)
Albumin: 3 g/dL — ABNORMAL LOW (ref 3.5–5.0)
Alkaline Phosphatase: 88 U/L (ref 38–126)
Anion gap: 5 (ref 5–15)
BUN: <5 mg/dL — ABNORMAL LOW (ref 6–20)
CO2: 25 mmol/L (ref 22–32)
Calcium: 8.9 mg/dL (ref 8.9–10.3)
Chloride: 103 mmol/L (ref 98–111)
Creatinine, Ser: 0.57 mg/dL (ref 0.44–1.00)
GFR, Estimated: >60 mL/min (ref >60)
Glucose, Bld: 93 mg/dL (ref 70–99)
Potassium: 3.8 mmol/L (ref 3.5–5.1)
Sodium: 133 mmol/L — ABNORMAL LOW (ref 135–145)
Total Bilirubin: 0.6 mg/dL (ref 0.3–1.2)
Total Protein: 6.4 g/dL — ABNORMAL LOW (ref 6.5–8.1)

## 2020-10-31 LAB — RAPID URINE DRUG SCREEN, HOSP PERFORMED
Amphetamines: NOT DETECTED
Barbiturates: NOT DETECTED
Benzodiazepines: NOT DETECTED
Cocaine: NOT DETECTED
Opiates: NOT DETECTED
Tetrahydrocannabinol: POSITIVE — AB

## 2020-10-31 MED ORDER — ACETAMINOPHEN 500 MG PO TABS: 1000.0000 mg | ORAL_TABLET | Freq: Three times a day (TID) | ORAL | 0 refills | Status: DC | PRN

## 2020-10-31 NOTE — Discharge Instructions (Signed)
[employer]osen's Emergency Medicine: Concepts and Clinical Practice (9th ed., pp. 2296- 2312). Elsevier.">  Braxton Hicks Contractions Contractions of the uterus can occur throughout pregnancy, but they are not always a sign that you are in labor. You may have practice contractions called Braxton Hicks contractions. These false labor contractions are sometimes confused with true labor. What are Braxton Hicks contractions? Braxton Hicks contractions are tightening movements that occur in the muscles of the uterus before labor. Unlike true labor contractions, these contractions do not result in opening (dilation) and thinning of the cervix. Toward the end of pregnancy (32-34 weeks), Braxton Hicks contractions can happen more often and may become stronger. These contractions are sometimes difficult to tell apart from true labor because they can be very uncomfortable. You should not feel embarrassed if you go to the hospital with false labor. Sometimes, the only way to tell if you are in true labor is for your health care provider to look for changes in the cervix. The health care provider will do a physical exam and may monitor your contractions. If you are not in true labor, the exam should show that your cervix is not dilating and your water has not broken. If there are no other health problems associated with your pregnancy, it is completely safe for you to be sent home with false labor. You may continue to have Braxton Hicks contractions until you go into true labor. How to tell the difference between true labor and false labor True labor  Contractions last 30-70 seconds.  Contractions become very regular.  Discomfort is usually felt in the top of the uterus, and it spreads to the lower abdomen and low back.  Contractions do not go away with walking.  Contractions usually become more intense and increase in frequency.  The cervix dilates and gets thinner. False labor  Contractions are usually shorter  and not as strong as true labor contractions.  Contractions are usually irregular.  Contractions are often felt in the front of the lower abdomen and in the groin.  Contractions may go away when you walk around or change positions while lying down.  Contractions get weaker and are shorter-lasting as time goes on.  The cervix usually does not dilate or become thin. Follow these instructions at home:  Take over-the-counter and prescription medicines only as told by your health care provider.  Keep up with your usual exercises and follow other instructions from your health care provider.  Eat and drink lightly if you think you are going into labor.  If Braxton Hicks contractions are making you uncomfortable: ? Change your position from lying down or resting to walking, or change from walking to resting. ? Sit and rest in a tub of warm water. ? Drink enough fluid to keep your urine pale yellow. Dehydration may cause these contractions. ? Do slow and deep breathing several times an hour.  Keep all follow-up prenatal visits as told by your health care provider. This is important.   Contact a health care provider if:  You have a fever.  You have continuous pain in your abdomen. Get help right away if:  Your contractions become stronger, more regular, and closer together.  You have fluid leaking or gushing from your vagina.  You pass blood-tinged mucus (bloody show).  You have bleeding from your vagina.  You have low back pain that you never had before.  You feel your baby's head pushing down and causing pelvic pressure.  Your baby is not moving inside   you as much as it used to. Summary  Contractions that occur before labor are called Braxton Hicks contractions, false labor, or practice contractions.  Braxton Hicks contractions are usually shorter, weaker, farther apart, and less regular than true labor contractions. True labor contractions usually become progressively  stronger and regular, and they become more frequent.  Manage discomfort from Braxton Hicks contractions by changing position, resting in a warm bath, drinking plenty of water, or practicing deep breathing. This information is not intended to replace advice given to you by your health care provider. Make sure you discuss any questions you have with your health care provider. Document Revised: 05/05/2017 Document Reviewed: 10/06/2016 Elsevier Patient Education  2021 [employer] Inc.   Fetal Movement Counts Patient Name: ________________________________________________ Patient Due Date: ____________________  What is a fetal movement count? A fetal movement count is the number of times that you feel your baby move during a certain amount of time. This may also be called a fetal kick count. A fetal movement count is recommended for every pregnant woman. You may be asked to start counting fetal movements as early as week 28 of your pregnancy. Pay attention to when your baby is most active. You may notice your baby's sleep and wake cycles. You may also notice things that make your baby move more. You should do a fetal movement count:  When your baby is normally most active.  At the same time each day. A good time to count movements is while you are resting, after having something to eat and drink. How do I count fetal movements? 1. Find a quiet, comfortable area. Sit, or lie down on your side. 2. Write down the date, the start time and stop time, and the number of movements that you felt between those two times. Take this information with you to your health care visits. 3. Write down your start time when you feel the first movement. 4. Count kicks, flutters, swishes, rolls, and jabs. You should feel at least 10 movements. 5. You may stop counting after you have felt 10 movements, or if you have been counting for 2 hours. Write down the stop time. 6. If you do not feel 10 movements in 2 hours, contact  your health care provider for further instructions. Your health care provider may want to do additional tests to assess your baby's well-being. Contact a health care provider if:  You feel fewer than 10 movements in 2 hours.  Your baby is not moving like he or she usually does. Date: ____________ Start time: ____________ Stop time: ____________ Movements: ____________ Date: ____________ Start time: ____________ Stop time: ____________ Movements: ____________ Date: ____________ Start time: ____________ Stop time: ____________ Movements: ____________ Date: ____________ Start time: ____________ Stop time: ____________ Movements: ____________ Date: ____________ Start time: ____________ Stop time: ____________ Movements: ____________ Date: ____________ Start time: ____________ Stop time: ____________ Movements: ____________ Date: ____________ Start time: ____________ Stop time: ____________ Movements: ____________ Date: ____________ Start time: ____________ Stop time: ____________ Movements: ____________ Date: ____________ Start time: ____________ Stop time: ____________ Movements: ____________ This information is not intended to replace advice given to you by your health care provider. Make sure you discuss any questions you have with your health care provider. Document Revised: 01/10/2019 Document Reviewed: 01/10/2019 Elsevier Patient Education  2021 [employer]vier Inc.  

## 2020-11-03 LAB — GC/CHLAMYDIA PROBE AMP (~~LOC~~) NOT AT ARMC
Chlamydia: NEGATIVE
Comment: NEGATIVE
Comment: NORMAL
Neisseria Gonorrhea: NEGATIVE

## 2020-11-09 ENCOUNTER — Other Ambulatory Visit: Payer: Self-pay

## 2020-11-09 ENCOUNTER — Ambulatory Visit: Payer: Medicaid Other | Admitting: *Deleted

## 2020-11-09 ENCOUNTER — Encounter: Payer: Self-pay | Admitting: *Deleted

## 2020-11-09 ENCOUNTER — Ambulatory Visit: Payer: Medicaid Other | Attending: Obstetrics and Gynecology

## 2020-11-09 ENCOUNTER — Other Ambulatory Visit: Payer: Self-pay | Admitting: Obstetrics and Gynecology

## 2020-11-09 DIAGNOSIS — R109 Unspecified abdominal pain: Secondary | ICD-10-CM

## 2020-11-09 DIAGNOSIS — Z3480 Encounter for supervision of other normal pregnancy, unspecified trimester: Secondary | ICD-10-CM | POA: Insufficient documentation

## 2020-11-09 DIAGNOSIS — O34219 Maternal care for unspecified type scar from previous cesarean delivery: Secondary | ICD-10-CM | POA: Diagnosis not present

## 2020-11-09 DIAGNOSIS — O365990 Maternal care for other known or suspected poor fetal growth, unspecified trimester, not applicable or unspecified: Secondary | ICD-10-CM | POA: Insufficient documentation

## 2020-11-09 DIAGNOSIS — O26893 Other specified pregnancy related conditions, third trimester: Secondary | ICD-10-CM

## 2020-11-09 DIAGNOSIS — O403XX0 Polyhydramnios, third trimester, not applicable or unspecified: Secondary | ICD-10-CM | POA: Diagnosis not present

## 2020-11-09 DIAGNOSIS — O365930 Maternal care for other known or suspected poor fetal growth, third trimester, not applicable or unspecified: Secondary | ICD-10-CM | POA: Diagnosis not present

## 2020-11-09 DIAGNOSIS — Z3A31 31 weeks gestation of pregnancy: Secondary | ICD-10-CM

## 2020-11-09 NOTE — Procedures (Signed)
Deanna Cooper 2000/11/07 [redacted]w[redacted]d  Fetus A Non-Stress Test Interpretation for 11/09/20  Indication: IUGR  Fetal Heart Rate A Mode: External Baseline Rate (A): 140 bpm Variability: Moderate Accelerations: 15 x 15 Decelerations: None Multiple birth?: No  Uterine Activity Mode: Palpation,Toco Contraction Frequency (min): ui Contraction Duration (sec): 10 Contraction Quality: Mild Resting Tone Palpated: Relaxed Resting Time: Adequate  Interpretation (Fetal Testing) Nonstress Test Interpretation: Reactive Overall Impression: Reassuring for gestational age Comments: Dr. Parke Poisson reviewed tracing

## 2020-11-10 ENCOUNTER — Other Ambulatory Visit: Payer: Self-pay | Admitting: *Deleted

## 2020-11-10 DIAGNOSIS — O365930 Maternal care for other known or suspected poor fetal growth, third trimester, not applicable or unspecified: Secondary | ICD-10-CM

## 2020-11-11 ENCOUNTER — Other Ambulatory Visit: Payer: Self-pay | Admitting: *Deleted

## 2020-11-11 DIAGNOSIS — O365930 Maternal care for other known or suspected poor fetal growth, third trimester, not applicable or unspecified: Secondary | ICD-10-CM

## 2020-11-12 ENCOUNTER — Other Ambulatory Visit: Payer: Self-pay

## 2020-11-12 ENCOUNTER — Encounter: Payer: Self-pay | Admitting: Obstetrics and Gynecology

## 2020-11-12 ENCOUNTER — Ambulatory Visit (INDEPENDENT_AMBULATORY_CARE_PROVIDER_SITE_OTHER): Payer: Medicaid Other | Admitting: Obstetrics and Gynecology

## 2020-11-12 VITALS — BP 102/66 | HR 93 | Temp 98.2°F | Wt 122.8 lb

## 2020-11-12 DIAGNOSIS — Z3A32 32 weeks gestation of pregnancy: Secondary | ICD-10-CM

## 2020-11-12 DIAGNOSIS — Z3480 Encounter for supervision of other normal pregnancy, unspecified trimester: Secondary | ICD-10-CM

## 2020-11-12 DIAGNOSIS — O34219 Maternal care for unspecified type scar from previous cesarean delivery: Secondary | ICD-10-CM

## 2020-11-12 DIAGNOSIS — O210 Mild hyperemesis gravidarum: Secondary | ICD-10-CM

## 2020-11-12 MED ORDER — ONDANSETRON 8 MG PO TBDP: 8.0000 mg | ORAL_TABLET | Freq: Three times a day (TID) | ORAL | 1 refills | Status: DC | PRN

## 2020-11-12 NOTE — Progress Notes (Signed)
   LOW-RISK PREGNANCY OFFICE VISIT Patient name: Deanna Cooper MRN [id number]  Date of birth: Dec 15, 2000 Chief Complaint:   Routine Prenatal Visit  History of Present Illness:   Deanna Cooper is a 20 y.o. G98P1001 female at [redacted]w[redacted]d with an Estimated Date of Delivery: 01/06/21 being seen today for ongoing management of a low-risk pregnancy.  Today she reports nausea. Contractions: Irregular. Vag. Bleeding: None.  Movement: Present. denies leaking of fluid. Review of Systems:   Pertinent items are noted in HPI Denies abnormal vaginal discharge w/ itching/odor/irritation, headaches, visual changes, shortness of breath, chest pain, abdominal pain, severe nausea/vomiting, or problems with urination or bowel movements unless otherwise stated above. Pertinent History Reviewed:  Reviewed past medical,surgical, social, obstetrical and family history.  Reviewed problem list, medications and allergies. Physical Assessment:   Vitals:   11/12/20 1058  BP: 102/66  Pulse: 93  Temp: 98.2 F (36.8 C)  Weight: 122 lb 12.8 oz (55.7 kg)  Body mass index is 23.2 kg/m.        Physical Examination:   General appearance: Well appearing, and in no distress  Mental status: Alert, oriented to person, place, and time  Skin: Warm & dry  Cardiovascular: Normal heart rate noted  Respiratory: Normal respiratory effort, no distress  Abdomen: Soft, gravid, nontender  Pelvic: Cervical exam deferred         Extremities: Edema: None  Fetal Status: Fetal Heart Rate (bpm): 144 Fundal Height: 33 cm Movement: Present Presentation: Undeterminable  No results found for this or any previous visit (from the past 24 hour(s)).  Assessment & Plan:  1) Low-risk pregnancy G2P1001 at [redacted]w[redacted]d with an Estimated Date of Delivery: 01/06/21   2) Supervision of other normal pregnancy, antepartum - Neg TOC for CT on 10/30/2020  3) Morning sickness  - Rx for ondansetron (ZOFRAN ODT) 8 MG disintegrating tablet  4) Previous cesarean  delivery affecting pregnancy, antepartum - VBAC Consent not signed with Dr. Crissie Reese, but r/b discussed at length - VBAC Consent signed today electing a RCS with BTL (consent signed 10/15/20) - Msg sent to Billie Ruddy to get surgery scheduled at 39 wks (patient prefers NO Thursday appts d/t family scheduling conflict.  5) [redacted] weeks gestation of pregnancy   Meds:  Meds ordered this encounter  Medications   ondansetron (ZOFRAN ODT) 8 MG disintegrating tablet    Sig: Take 1 tablet (8 mg total) by mouth every 8 (eight) hours as needed for nausea or vomiting.    Dispense:  30 tablet    Refill:  1   Labs/procedures today: none  Plan:  Continue routine obstetrical care   Reviewed: Preterm labor symptoms and general obstetric precautions including but not limited to vaginal bleeding, contractions, leaking of fluid and fetal movement were reviewed in detail with the patient.  All questions were answered. Has home bp cuff. Check bp weekly, let us know if >140/90.   Follow-up: Return in about 4 weeks (around 12/10/2020) for Return OB w/GBS.  No orders of the defined types were placed in this encounter.  Raelyn Mora MSN, CNM 11/12/2020 11:10 AM

## 2020-11-16 ENCOUNTER — Telehealth: Payer: Self-pay | Admitting: *Deleted

## 2020-11-16 ENCOUNTER — Encounter: Payer: Self-pay | Admitting: *Deleted

## 2020-11-16 ENCOUNTER — Other Ambulatory Visit: Payer: Self-pay | Admitting: Obstetrics and Gynecology

## 2020-11-16 NOTE — Telephone Encounter (Signed)
Call to patient. Left message on voice mail that procedure scheduled as planned and letter has been mail as well as visible on My Chart. Can call back to [phone] if questions. C-section is scheduled for 12-30-20 0930. Arrive 0730.

## 2020-11-17 ENCOUNTER — Telehealth: Payer: Self-pay | Admitting: Family Medicine

## 2020-11-17 NOTE — Telephone Encounter (Signed)
Attempted to reach patient via telephone. Left a message about her appointment scheduled at the [facility] with Dr. Crissie Reese.

## 2020-11-20 ENCOUNTER — Other Ambulatory Visit: Payer: Medicaid Other

## 2020-11-20 ENCOUNTER — Other Ambulatory Visit: Payer: Self-pay

## 2020-11-20 ENCOUNTER — Ambulatory Visit: Payer: Medicaid Other

## 2020-11-20 ENCOUNTER — Encounter: Payer: Medicaid Other | Admitting: Family Medicine

## 2020-11-25 ENCOUNTER — Other Ambulatory Visit: Payer: Self-pay

## 2020-11-25 ENCOUNTER — Ambulatory Visit: Payer: Medicaid Other

## 2020-12-02 ENCOUNTER — Other Ambulatory Visit: Payer: Self-pay

## 2020-12-02 ENCOUNTER — Ambulatory Visit: Payer: Medicaid Other | Admitting: *Deleted

## 2020-12-02 ENCOUNTER — Encounter: Payer: Self-pay | Admitting: *Deleted

## 2020-12-02 ENCOUNTER — Ambulatory Visit: Payer: Medicaid Other

## 2020-12-02 ENCOUNTER — Ambulatory Visit: Payer: Medicaid Other | Attending: Obstetrics

## 2020-12-02 VITALS — BP 119/62 | HR 85

## 2020-12-02 DIAGNOSIS — Z3480 Encounter for supervision of other normal pregnancy, unspecified trimester: Secondary | ICD-10-CM | POA: Diagnosis not present

## 2020-12-02 DIAGNOSIS — O403XX0 Polyhydramnios, third trimester, not applicable or unspecified: Secondary | ICD-10-CM | POA: Diagnosis not present

## 2020-12-02 DIAGNOSIS — R109 Unspecified abdominal pain: Secondary | ICD-10-CM | POA: Diagnosis not present

## 2020-12-02 DIAGNOSIS — O365930 Maternal care for other known or suspected poor fetal growth, third trimester, not applicable or unspecified: Secondary | ICD-10-CM

## 2020-12-02 DIAGNOSIS — O26893 Other specified pregnancy related conditions, third trimester: Secondary | ICD-10-CM

## 2020-12-02 DIAGNOSIS — O34219 Maternal care for unspecified type scar from previous cesarean delivery: Secondary | ICD-10-CM | POA: Diagnosis not present

## 2020-12-02 DIAGNOSIS — Z3A35 35 weeks gestation of pregnancy: Secondary | ICD-10-CM

## 2020-12-02 NOTE — Procedures (Signed)
Deanna Cooper [birth year] [redacted]w[redacted]d  Fetus A Non-Stress Test Interpretation for 12/02/20  Indication: IUGR  Fetal Heart Rate A Mode: External Baseline Rate (A): 140 bpm Variability: Moderate Accelerations: 15 x 15 Decelerations: None Multiple birth?: No  Uterine Activity Mode: Palpation, Toco Contraction Frequency (min): 1 uc Contraction Duration (sec): 80 Contraction Quality: Mild Resting Tone Palpated: Relaxed Resting Time: Adequate  Interpretation (Fetal Testing) Nonstress Test Interpretation: Reactive Overall Impression: Reassuring for gestational age Comments: Dr. Parke Poisson reviewed tracing.

## 2020-12-09 ENCOUNTER — Telehealth: Payer: Self-pay | Admitting: *Deleted

## 2020-12-09 ENCOUNTER — Ambulatory Visit: Payer: Medicaid Other | Admitting: *Deleted

## 2020-12-09 ENCOUNTER — Ambulatory Visit (HOSPITAL_BASED_OUTPATIENT_CLINIC_OR_DEPARTMENT_OTHER): Payer: Medicaid Other | Admitting: Obstetrics and Gynecology

## 2020-12-09 ENCOUNTER — Ambulatory Visit: Payer: Medicaid Other | Attending: Obstetrics

## 2020-12-09 ENCOUNTER — Other Ambulatory Visit: Payer: Self-pay

## 2020-12-09 ENCOUNTER — Ambulatory Visit (INDEPENDENT_AMBULATORY_CARE_PROVIDER_SITE_OTHER): Payer: Medicaid Other | Admitting: Obstetrics and Gynecology

## 2020-12-09 ENCOUNTER — Other Ambulatory Visit (HOSPITAL_COMMUNITY)
Admission: RE | Admit: 2020-12-09 | Discharge: 2020-12-09 | Disposition: A | Discharge status: Home / Self Care | Payer: Medicaid Other | Source: Ambulatory Visit | Attending: Obstetrics and Gynecology | Admitting: Obstetrics and Gynecology

## 2020-12-09 ENCOUNTER — Encounter: Payer: Self-pay | Admitting: *Deleted

## 2020-12-09 VITALS — BP 120/66 | HR 78 | Temp 97.9°F | Wt 124.6 lb

## 2020-12-09 VITALS — BP 119/66 | HR 77

## 2020-12-09 DIAGNOSIS — Z3A36 36 weeks gestation of pregnancy: Secondary | ICD-10-CM | POA: Insufficient documentation

## 2020-12-09 DIAGNOSIS — N898 Other specified noninflammatory disorders of vagina: Secondary | ICD-10-CM

## 2020-12-09 DIAGNOSIS — Z3480 Encounter for supervision of other normal pregnancy, unspecified trimester: Secondary | ICD-10-CM | POA: Insufficient documentation

## 2020-12-09 DIAGNOSIS — O365990 Maternal care for other known or suspected poor fetal growth, unspecified trimester, not applicable or unspecified: Secondary | ICD-10-CM

## 2020-12-09 DIAGNOSIS — O365930 Maternal care for other known or suspected poor fetal growth, third trimester, not applicable or unspecified: Secondary | ICD-10-CM

## 2020-12-09 DIAGNOSIS — Z5900 Homelessness unspecified: Secondary | ICD-10-CM

## 2020-12-09 DIAGNOSIS — O34219 Maternal care for unspecified type scar from previous cesarean delivery: Secondary | ICD-10-CM | POA: Insufficient documentation

## 2020-12-09 MED ORDER — TERCONAZOLE 0.4 % VA CREA
1.0000 | TOPICAL_CREAM | Freq: Every day | VAGINAL | 0 refills | Status: DC
Start: 2020-12-09 — End: 2020-12-18

## 2020-12-09 NOTE — Progress Notes (Signed)
HIGH-RISK PREGNANCY OFFICE VISIT Patient name: Deanna Cooper MRN [id number]  Date of birth: 09/07/2000 Chief Complaint:   Routine Prenatal Visit  History of Present Illness:   Deanna Cooper is a 20 y.o. G43P1001 female at [redacted]w[redacted]d with an Estimated Date of Delivery: 01/06/21 being seen today for ongoing management of a high-risk pregnancy complicated by fetal growth restriction 2.6% Today she reports nausea and vomiting. Contractions: Irregular. Vag. Bleeding: None.  Movement: Present. denies leaking of fluid.  Review of Systems:   Pertinent items are noted in HPI Denies abnormal vaginal discharge w/ itching/odor/irritation, headaches, visual changes, shortness of breath, chest pain, abdominal pain, severe nausea/vomiting, or problems with urination or bowel movements unless otherwise stated above. Pertinent History Reviewed:  Reviewed past medical,surgical, social, obstetrical and family history.  Reviewed problem list, medications and allergies. Physical Assessment:   Vitals:   12/09/20 1117  BP: 120/66  Pulse: 78  Temp: 97.9 F (36.6 C)  Weight: 124 lb 9.6 oz (56.5 kg)  Body mass index is 23.54 kg/m.           Physical Examination:   General appearance: alert, well appearing, and in no distress and oriented to person, place, and time  Mental status: alert, oriented to person, place, and time, normal mood, behavior, speech, dress, motor activity, and thought processes  Skin: warm & dry   Extremities: Edema: None    Cardiovascular: normal heart rate noted  Respiratory: normal respiratory effort, no distress  Abdomen: gravid, soft, non-tender  Pelvic: Cervical exam performed  Dilation: Closed Effacement (%): Thick Station: Ballotable  Fetal Status: Fetal Heart Rate (bpm): 140 Fundal Height: 34 cm Movement: Present Presentation: Vertex  Fetal Surveillance Testing today: U/S earlier today (results below)   [place] MFM FETAL BPP W/NONSTRESS (Accession [id number]) (Order  [id number]) Imaging Date: 12/09/2020 Department: [facility] Released By: Elesa Hacker Authorizing: Barton Dubois, MD   Narrative & Impression  ----------------------------------------------------------------------  OBSTETRICS REPORT                       (Signed Final 12/09/2020 10:14 am) ---------------------------------------------------------------------- Patient Info  ID #:       [id number]                          D.O.B.:  2001-02-09 (19 yrs)  Name:       Deanna Cooper                    Visit Date: 12/09/2020 08:22 am ---------------------------------------------------------------------- Performed By  Attending:        Noralee Space MD        Referred By:      [facility]  Performed By:     Sandi Mealy        [facility] ---------------------------------------------------------------------- Orders  #  Description                           Code        Ordered By  1  [place] MFM UA CORD DOPPLER                [phone]    Rosana Hoes  2  [place] MFM FETAL BPP                      [id number]     Rosana Hoes     W/NONSTRESS ----------------------------------------------------------------------  #  Order #                     Accession #                Episode #  1  [id number]                   [id number]                 [id number]  2  [id number]                   [id number]                 [id number] ---------------------------------------------------------------------- Indications  Maternal care for known or suspected poor      O36.5931  fetal growth, third trimester, fetus 1 IUGR  Abdominal pain in pregnancy                    O99.89  Previous cesarean delivery, antepartum         O34.219  [redacted] weeks gestation of  pregnancy                Z3A.36 ---------------------------------------------------------------------- Fetal Evaluation  Num Of Fetuses:         1  Fetal Heart Rate(bpm):  132  Cardiac Activity:       Observed  Presentation:           Cephalic  Placenta:               Fundal  P. Cord Insertion:      Visualized  Amniotic Fluid  AFI FV:      Within normal limits  AFI Sum(cm)     %Tile       Largest Pocket(cm)  21.25           80          7.32  RUQ(cm)       RLQ(cm)       LUQ(cm)        LLQ(cm)  7.32          3.97          4.26           5.7 ---------------------------------------------------------------------- Biophysical Evaluation  Amniotic F.V:   Pocket => 2 cm             F. Tone:  Observed  F. Movement:    Observed                   N.S.T:          Reactive  F. Breathing:   Observed                   Score:          10/10 ---------------------------------------------------------------------- OB History  Blood Type:   AB+  Gravidity:    2         Term:   1        Prem:   0        SAB:   0  TOP:          0       Ectopic:  0        Living: 1 ---------------------------------------------------------------------- Gestational Age  LMP:           36w 0d        Date:  04/01/20                 EDD:   01/06/21  Best:          [name]36w 0d     Det. By:  LMP  (04/01/20)          EDD:   01/06/21 ---------------------------------------------------------------------- Doppler - Fetal Vessels  Umbilical Artery   S/D     %tile      RI    %tile      PI    %tile            ADFV    RDFV   4.15   > 97.5    0.76   > 97.5    1.25   > 97.5               No      No ---------------------------------------------------------------------- Impression  Severe fetal growth restriction.  On ultrasound performed last  week, the estimated fetal weight was at the 3rd percentile.  She does not have hypertension or diabetes or any other  chronic medical conditions. She does not have gestational  diabetes.   Obstetric history significant for a term cesarean delivery in  2019 of a female infant weighing 6 pounds and 8 ounces at  birth.  Amniotic fluid is normal and good fetal activity seen.  Umbilical artery Doppler showed increased S/D ratio.  NST is  reactive.  BPP 10/10.  Blood pressure today at her office is 119/66 mm Hg.  I discussed ultrasound findings and abnormal Doppler  studies.  Her pregnancy is complicated by severe fetal growth  restriction with abnormal Doppler study and the increase the  risk of perinatal mortality and morbidities.  I have recommended delivery at [redacted] weeks gestation.  Patient has a prenatal visit appointment today after  ultrasound. ---------------------------------------------------------------------- Recommendations  -Recommend delivery at [redacted] weeks gestation. ----------------------------------------------------------------------                  Noralee Spaceavi Shankar, MD Electronically Signed Final Report   12/09/2020 10:14 am ----------------------------------------------------------------------  There are no hospital account-level documents.   Assessment & Plan:  1) High-risk pregnancy G2P1001 at 4821w0d with an Estimated Date of Delivery: 01/06/21   2) Supervision of other normal pregnancy, antepartum  - Culture, beta strep (group b only),  - Cervicovaginal ancillary only( [place]),   3)  Vaginal irritation  - Rx for terconazole (TERAZOL 7) 0.4 % vaginal  cream  4) [redacted] weeks gestation of pregnancy  5) Pregnancy affected by fetal growth restriction  - Delivery recommended at 37 weeks per Dr. Judeth Cornfield - RCS with BTL rescheduled to 12/16/2020 at 2:30 PM - MyChart message sent by Alisa Graff. Patient also notified by telephone.  - Patient reports her FOB's mother states they already have his brother's knee surgery scheduled for the same day. Expressed to patient that it is important    Meds:  Meds ordered this encounter  Medications   terconazole (TERAZOL  7) 0.4 % vaginal cream    Sig: Place 1 applicator vaginally at bedtime for 7 days.    Dispense:  45 g    Refill:  0    Order Specific Question:   Supervising Provider    Answer:   Reva Bores [[id number]]    Labs/procedures today: Discussion of homelessness and resources given by LCSW  Treatment Plan:  move RCS/BTL up to next week  Reviewed: Preterm labor symptoms and general obstetric precautions including but not limited to vaginal bleeding, contractions, leaking of fluid and fetal movement were reviewed in detail with the patient.  All questions were answered.   Follow-up: No follow-ups on file.  Orders Placed This Encounter  Procedures   Culture, beta strep (group b only)   Raelyn Mora MSN, CNM 12/09/2020 11:34 AM

## 2020-12-09 NOTE — Procedures (Signed)
Deanna Cooper 12/06/2000 [redacted]w[redacted]d  Fetus A Non-Stress Test Interpretation for 12/09/20  Indication: IUGR  Fetal Heart Rate A Mode: External Baseline Rate (A): 140 bpm Variability: Moderate Accelerations: 15 x 15 Decelerations: None Multiple birth?: No  Uterine Activity Mode: Palpation, Toco Contraction Frequency (min): 1 uc Contraction Duration (sec): 60 Contraction Quality: Mild Resting Tone Palpated: Relaxed Resting Time: Adequate  Interpretation (Fetal Testing) Nonstress Test Interpretation: Reactive Overall Impression: Reassuring for gestational age Comments: Dr. Judeth Cornfield reviewed tracing

## 2020-12-09 NOTE — Progress Notes (Addendum)
MFM Consultation  Severe fetal growth restriction.  On ultrasound performed last week, the estimated fetal weight was at the 3rd percentile.  She does not have hypertension or diabetes or any other chronic medical conditions. She does not have gestational diabetes.  Obstetric history significant for a term cesarean delivery in 2019 of a female infant weighing 6 pounds and 8 ounces at birth.  Ultrasound Amniotic fluid is normal and good fetal activity seen.  Umbilical artery Doppler showed increased S/D ratio.  NST is reactive.  BPP 10/10.  Blood pressure today at her office is 119/66 mm Hg.  I discussed ultrasound findings and abnormal Doppler studies.  Her pregnancy is complicated by severe fetal growth restriction with abnormal Doppler study and the increase the risk of perinatal mortality and morbidities.  I recommended delivery at [redacted] weeks gestation.  Patient has a prenatal visit appointment today after ultrasound.   Recommendations: -Delivery at 37 weeks' gestation.  Thank you for consultation.  If you have any questions or concerns, please contact me the [facility].  Consultation including face-to-face counseling 15 minutes.

## 2020-12-09 NOTE — Telephone Encounter (Signed)
Call to patient- 2 attempts- to advise of new date for c-section.  Call drops before can leave message- no answer or voice mail, simply stops ringing. Letter sent via mail also visible in My Chart.   My Chart message will be sent as well.   Encounter closed.

## 2020-12-09 NOTE — Patient Instructions (Signed)
Aalaysia Liggins  12/09/2020   Your procedure is scheduled on:  12/16/2020  Arrive at 1245 at Entrance C on [address] at [facility]  and [employer]. You are invited to use the FREE valet parking or use the Visitor's parking deck.  Pick up the phone at the desk and dial [phone].  Call this number if you have problems the morning of surgery: [phone]  Remember:   Do not eat food:(After Midnight) Desps de medianoche.  Do not drink clear liquids: (After Midnight) Desps de medianoche.  Take these medicines the morning of surgery with A SIP OF WATER:  none   Do not wear jewelry, make-up or nail polish.  Do not wear lotions, powders, or perfumes. Do not wear deodorant.  Do not shave 48 hours prior to surgery.  Do not bring valuables to the hospital.  [facility] is not   responsible for any belongings or valuables brought to the hospital.  Contacts, dentures or bridgework may not be worn into surgery.  Leave suitcase in the car. After surgery it may be brought to your room.  For patients admitted to the hospital, checkout time is 11:00 AM the day of              discharge.      Please read over the following fact sheets that you were given:     Preparing for Surgery

## 2020-12-10 ENCOUNTER — Telehealth (HOSPITAL_COMMUNITY): Payer: Self-pay | Admitting: *Deleted

## 2020-12-10 ENCOUNTER — Encounter: Payer: Medicaid Other | Admitting: Obstetrics and Gynecology

## 2020-12-10 LAB — CERVICOVAGINAL ANCILLARY ONLY
Bacterial Vaginitis (gardnerella): POSITIVE — AB
Candida Glabrata: NEGATIVE
Candida Vaginitis: POSITIVE — AB
Chlamydia: NEGATIVE
Comment: NEGATIVE
Comment: NEGATIVE
Comment: NEGATIVE
Comment: NEGATIVE
Comment: NEGATIVE
Comment: NORMAL
Neisseria Gonorrhea: NEGATIVE
Trichomonas: NEGATIVE

## 2020-12-10 NOTE — Telephone Encounter (Signed)
Preadmission screen  

## 2020-12-11 ENCOUNTER — Encounter (HOSPITAL_COMMUNITY): Payer: Self-pay

## 2020-12-13 LAB — CULTURE, BETA STREP (GROUP B ONLY): Strep Gp B Culture: NEGATIVE

## 2020-12-14 ENCOUNTER — Encounter (HOSPITAL_COMMUNITY)
Admission: RE | Admit: 2020-12-14 | Discharge: 2020-12-14 | Disposition: A | Discharge status: Home / Self Care | Payer: Medicaid Other | Source: Ambulatory Visit | Attending: Obstetrics and Gynecology | Admitting: Obstetrics and Gynecology

## 2020-12-14 ENCOUNTER — Other Ambulatory Visit (HOSPITAL_COMMUNITY)
Admission: RE | Admit: 2020-12-14 | Discharge: 2020-12-14 | Disposition: A | Discharge status: Home / Self Care | Payer: Medicaid Other | Source: Ambulatory Visit | Attending: Obstetrics and Gynecology | Admitting: Obstetrics and Gynecology

## 2020-12-14 ENCOUNTER — Other Ambulatory Visit: Payer: Self-pay

## 2020-12-14 DIAGNOSIS — Z20822 Contact with and (suspected) exposure to covid-19: Secondary | ICD-10-CM | POA: Insufficient documentation

## 2020-12-14 DIAGNOSIS — Z01812 Encounter for preprocedural laboratory examination: Secondary | ICD-10-CM | POA: Diagnosis not present

## 2020-12-14 HISTORY — DX: Unspecified abnormal cytological findings in specimens from vagina: R87.629

## 2020-12-14 LAB — CBC
HCT: 33.8 % — ABNORMAL LOW (ref 36.0–46.0)
Hemoglobin: 11.2 g/dL — ABNORMAL LOW (ref 12.0–15.0)
MCH: 30.2 pg (ref 26.0–34.0)
MCHC: 33.1 g/dL (ref 30.0–36.0)
MCV: 91.1 fL (ref 80.0–100.0)
Platelets: 342 10*3/uL (ref 150–400)
RBC: 3.71 MIL/uL — ABNORMAL LOW (ref 3.87–5.11)
RDW: 12.3 % (ref 11.5–15.5)
WBC: 6.9 10*3/uL (ref 4.0–10.5)
nRBC: 0 % (ref 0.0–0.2)

## 2020-12-14 LAB — TYPE AND SCREEN
ABO/RH(D): AB POS
Antibody Screen: NEGATIVE

## 2020-12-15 LAB — RPR: RPR Ser Ql: NONREACTIVE

## 2020-12-15 LAB — SARS CORONAVIRUS 2 (TAT 6-24 HRS): SARS Coronavirus 2: NEGATIVE

## 2020-12-16 ENCOUNTER — Inpatient Hospital Stay (HOSPITAL_COMMUNITY): Payer: Medicaid Other | Admitting: Anesthesiology

## 2020-12-16 ENCOUNTER — Encounter (HOSPITAL_COMMUNITY): Payer: Self-pay | Admitting: Obstetrics and Gynecology

## 2020-12-16 ENCOUNTER — Inpatient Hospital Stay (HOSPITAL_COMMUNITY)
Admission: RE | Admit: 2020-12-16 | Discharge: 2020-12-18 | DRG: 787 | Disposition: A | Discharge status: Home / Self Care | Payer: Medicaid Other | Attending: Obstetrics and Gynecology | Admitting: Obstetrics and Gynecology

## 2020-12-16 ENCOUNTER — Other Ambulatory Visit: Payer: Self-pay

## 2020-12-16 ENCOUNTER — Encounter (HOSPITAL_COMMUNITY)
Admission: RE | Disposition: A | Discharge status: Home / Self Care | Payer: Self-pay | Source: Home / Self Care | Attending: Obstetrics and Gynecology

## 2020-12-16 ENCOUNTER — Ambulatory Visit: Payer: Medicaid Other

## 2020-12-16 DIAGNOSIS — F1290 Cannabis use, unspecified, uncomplicated: Secondary | ICD-10-CM | POA: Diagnosis not present

## 2020-12-16 DIAGNOSIS — Z3A37 37 weeks gestation of pregnancy: Secondary | ICD-10-CM

## 2020-12-16 DIAGNOSIS — Z30017 Encounter for initial prescription of implantable subdermal contraceptive: Secondary | ICD-10-CM | POA: Diagnosis not present

## 2020-12-16 DIAGNOSIS — Z98891 History of uterine scar from previous surgery: Secondary | ICD-10-CM

## 2020-12-16 DIAGNOSIS — O99324 Drug use complicating childbirth: Secondary | ICD-10-CM | POA: Diagnosis not present

## 2020-12-16 DIAGNOSIS — O99824 Streptococcus B carrier state complicating childbirth: Secondary | ICD-10-CM | POA: Diagnosis not present

## 2020-12-16 DIAGNOSIS — O34211 Maternal care for low transverse scar from previous cesarean delivery: Secondary | ICD-10-CM | POA: Diagnosis not present

## 2020-12-16 DIAGNOSIS — O98312 Other infections with a predominantly sexual mode of transmission complicating pregnancy, second trimester: Secondary | ICD-10-CM | POA: Diagnosis present

## 2020-12-16 DIAGNOSIS — A568 Sexually transmitted chlamydial infection of other sites: Secondary | ICD-10-CM | POA: Diagnosis present

## 2020-12-16 DIAGNOSIS — O365990 Maternal care for other known or suspected poor fetal growth, unspecified trimester, not applicable or unspecified: Secondary | ICD-10-CM | POA: Diagnosis present

## 2020-12-16 DIAGNOSIS — O365930 Maternal care for other known or suspected poor fetal growth, third trimester, not applicable or unspecified: Secondary | ICD-10-CM | POA: Diagnosis not present

## 2020-12-16 DIAGNOSIS — O365931 Maternal care for other known or suspected poor fetal growth, third trimester, fetus 1: Secondary | ICD-10-CM | POA: Diagnosis not present

## 2020-12-16 SURGERY — Surgical Case
Anesthesia: Spinal | Wound class: Clean Contaminated

## 2020-12-16 MED ORDER — SIMETHICONE 80 MG PO CHEW
80.0000 mg | CHEWABLE_TABLET | Freq: Three times a day (TID) | ORAL | Status: DC
  Administered 2020-12-16 – 2020-12-18 (×5): 80 mg via ORAL
  Filled 2020-12-16 (×5): qty 1

## 2020-12-16 MED ORDER — OXYTOCIN-SODIUM CHLORIDE 30-0.9 UT/500ML-% IV SOLN: 2.5000 [IU]/h | INTRAVENOUS | Status: AC

## 2020-12-16 MED ORDER — MENTHOL 3 MG MT LOZG: 1.0000 | LOZENGE | OROMUCOSAL | Status: DC | PRN

## 2020-12-16 MED ORDER — SODIUM CHLORIDE 0.9% FLUSH: 3.0000 mL | INTRAVENOUS | Status: DC | PRN

## 2020-12-16 MED ORDER — DIBUCAINE (PERIANAL) 1 % EX OINT: 1.0000 "application " | TOPICAL_OINTMENT | CUTANEOUS | Status: DC | PRN

## 2020-12-16 MED ORDER — DIPHENHYDRAMINE HCL 25 MG PO CAPS: 25.0000 mg | ORAL_CAPSULE | Freq: Four times a day (QID) | ORAL | Status: DC | PRN

## 2020-12-16 MED ORDER — CEFAZOLIN SODIUM-DEXTROSE 2-4 GM/100ML-% IV SOLN
INTRAVENOUS | Status: AC
  Filled 2020-12-16: qty 100

## 2020-12-16 MED ORDER — NALBUPHINE HCL 10 MG/ML IJ SOLN
5.0000 mg | Freq: Once | INTRAMUSCULAR | Status: DC | PRN
Start: 2020-12-16 — End: 2020-12-18

## 2020-12-16 MED ORDER — ENOXAPARIN SODIUM 40 MG/0.4ML IJ SOSY
40.0000 mg | PREFILLED_SYRINGE | INTRAMUSCULAR | Status: DC
  Administered 2020-12-17 – 2020-12-18 (×2): 40 mg via SUBCUTANEOUS
  Filled 2020-12-16 (×2): qty 0.4

## 2020-12-16 MED ORDER — METOCLOPRAMIDE HCL 5 MG/ML IJ SOLN
INTRAMUSCULAR | Status: DC | PRN
  Administered 2020-12-16: 10 mg via INTRAVENOUS

## 2020-12-16 MED ORDER — IBUPROFEN 600 MG PO TABS
600.0000 mg | ORAL_TABLET | Freq: Four times a day (QID) | ORAL | Status: DC
  Filled 2020-12-16: qty 1

## 2020-12-16 MED ORDER — ONDANSETRON HCL 4 MG/2ML IJ SOLN
4.0000 mg | Freq: Three times a day (TID) | INTRAMUSCULAR | Status: DC | PRN
  Administered 2020-12-16: 4 mg via INTRAVENOUS
  Filled 2020-12-16: qty 2

## 2020-12-16 MED ORDER — OXYCODONE HCL 5 MG PO TABS
5.0000 mg | ORAL_TABLET | ORAL | Status: DC | PRN
  Administered 2020-12-17: 10 mg via ORAL
  Filled 2020-12-16: qty 2

## 2020-12-16 MED ORDER — COCONUT OIL OIL: 1.0000 "application " | TOPICAL_OIL | Status: DC | PRN

## 2020-12-16 MED ORDER — NALBUPHINE HCL 10 MG/ML IJ SOLN: 5.0000 mg | INTRAMUSCULAR | Status: DC | PRN

## 2020-12-16 MED ORDER — KETOROLAC TROMETHAMINE 30 MG/ML IJ SOLN
30.0000 mg | Freq: Four times a day (QID) | INTRAMUSCULAR | Status: DC
  Administered 2020-12-16 – 2020-12-17 (×3): 30 mg via INTRAVENOUS
  Filled 2020-12-16 (×4): qty 1

## 2020-12-16 MED ORDER — FENTANYL CITRATE (PF) 100 MCG/2ML IJ SOLN: 25.0000 ug | INTRAMUSCULAR | Status: DC | PRN

## 2020-12-16 MED ORDER — DIPHENHYDRAMINE HCL 25 MG PO CAPS: 25.0000 mg | ORAL_CAPSULE | ORAL | Status: DC | PRN

## 2020-12-16 MED ORDER — PHENYLEPHRINE HCL-NACL 20-0.9 MG/250ML-% IV SOLN
INTRAVENOUS | Status: DC | PRN
  Administered 2020-12-16: 40 ug/min via INTRAVENOUS
  Administered 2020-12-16: 100 ug/min via INTRAVENOUS

## 2020-12-16 MED ORDER — NALOXONE HCL 4 MG/10ML IJ SOLN
1.0000 ug/kg/h | INTRAVENOUS | Status: DC | PRN
  Filled 2020-12-16: qty 5

## 2020-12-16 MED ORDER — MEPERIDINE HCL 25 MG/ML IJ SOLN: 6.2500 mg | INTRAMUSCULAR | Status: DC | PRN

## 2020-12-16 MED ORDER — WITCH HAZEL-GLYCERIN EX PADS: 1.0000 "application " | MEDICATED_PAD | CUTANEOUS | Status: DC | PRN

## 2020-12-16 MED ORDER — MORPHINE SULFATE (PF) 0.5 MG/ML IJ SOLN
INTRAMUSCULAR | Status: AC
  Filled 2020-12-16: qty 10

## 2020-12-16 MED ORDER — DIPHENHYDRAMINE HCL 50 MG/ML IJ SOLN
12.5000 mg | INTRAMUSCULAR | Status: DC | PRN
  Administered 2020-12-16: 12.5 mg via INTRAVENOUS
  Filled 2020-12-16: qty 1

## 2020-12-16 MED ORDER — OXYTOCIN-SODIUM CHLORIDE 30-0.9 UT/500ML-% IV SOLN
INTRAVENOUS | Status: DC | PRN
  Administered 2020-12-16: 300 mL via INTRAVENOUS

## 2020-12-16 MED ORDER — DEXAMETHASONE SODIUM PHOSPHATE 10 MG/ML IJ SOLN
INTRAMUSCULAR | Status: DC | PRN
  Administered 2020-12-16: 4 mg via INTRAVENOUS

## 2020-12-16 MED ORDER — NALBUPHINE HCL 10 MG/ML IJ SOLN: 5.0000 mg | Freq: Once | INTRAMUSCULAR | Status: DC | PRN

## 2020-12-16 MED ORDER — ONDANSETRON HCL 4 MG/2ML IJ SOLN
INTRAMUSCULAR | Status: DC | PRN
  Administered 2020-12-16: 4 mg via INTRAVENOUS

## 2020-12-16 MED ORDER — KETOROLAC TROMETHAMINE 30 MG/ML IJ SOLN
30.0000 mg | Freq: Four times a day (QID) | INTRAMUSCULAR | Status: AC | PRN
  Administered 2020-12-16: 30 mg via INTRAVENOUS

## 2020-12-16 MED ORDER — TETANUS-DIPHTH-ACELL PERTUSSIS 5-2.5-18.5 LF-MCG/0.5 IM SUSY: 0.5000 mL | PREFILLED_SYRINGE | Freq: Once | INTRAMUSCULAR | Status: DC

## 2020-12-16 MED ORDER — STERILE WATER FOR IRRIGATION IR SOLN
Status: DC | PRN
  Administered 2020-12-16: 1

## 2020-12-16 MED ORDER — KETOROLAC TROMETHAMINE 30 MG/ML IJ SOLN
INTRAMUSCULAR | Status: AC
  Filled 2020-12-16: qty 1

## 2020-12-16 MED ORDER — DEXAMETHASONE SODIUM PHOSPHATE 10 MG/ML IJ SOLN
INTRAMUSCULAR | Status: AC
  Filled 2020-12-16: qty 1

## 2020-12-16 MED ORDER — FENTANYL CITRATE (PF) 100 MCG/2ML IJ SOLN
INTRAMUSCULAR | Status: AC
  Filled 2020-12-16: qty 2

## 2020-12-16 MED ORDER — MORPHINE SULFATE (PF) 0.5 MG/ML IJ SOLN
INTRAMUSCULAR | Status: DC | PRN
  Administered 2020-12-16: .15 mg via INTRATHECAL

## 2020-12-16 MED ORDER — LACTATED RINGERS IV SOLN: INTRAVENOUS | Status: DC

## 2020-12-16 MED ORDER — FENTANYL CITRATE (PF) 100 MCG/2ML IJ SOLN
INTRAMUSCULAR | Status: DC | PRN
  Administered 2020-12-16: 15 ug via INTRATHECAL

## 2020-12-16 MED ORDER — KETOROLAC TROMETHAMINE 30 MG/ML IJ SOLN: 30.0000 mg | Freq: Four times a day (QID) | INTRAMUSCULAR | Status: AC | PRN

## 2020-12-16 MED ORDER — ACETAMINOPHEN 500 MG PO TABS
1000.0000 mg | ORAL_TABLET | Freq: Four times a day (QID) | ORAL | Status: DC
  Administered 2020-12-16 – 2020-12-18 (×7): 1000 mg via ORAL
  Filled 2020-12-16 (×8): qty 2

## 2020-12-16 MED ORDER — BUPIVACAINE IN DEXTROSE 0.75-8.25 % IT SOLN
INTRATHECAL | Status: DC | PRN
  Administered 2020-12-16: 1.6 mL via INTRATHECAL

## 2020-12-16 MED ORDER — SENNOSIDES-DOCUSATE SODIUM 8.6-50 MG PO TABS
2.0000 | ORAL_TABLET | Freq: Every day | ORAL | Status: DC
  Administered 2020-12-17 – 2020-12-18 (×2): 2 via ORAL
  Filled 2020-12-16 (×2): qty 2

## 2020-12-16 MED ORDER — POVIDONE-IODINE 10 % EX SWAB
2.0000 "application " | Freq: Once | CUTANEOUS | Status: AC
  Administered 2020-12-16: 2 via TOPICAL

## 2020-12-16 MED ORDER — NALOXONE HCL 0.4 MG/ML IJ SOLN: 0.4000 mg | INTRAMUSCULAR | Status: DC | PRN

## 2020-12-16 MED ORDER — PRENATAL MULTIVITAMIN CH
1.0000 | ORAL_TABLET | Freq: Every day | ORAL | Status: DC
  Administered 2020-12-17 – 2020-12-18 (×2): 1 via ORAL
  Filled 2020-12-16 (×2): qty 1

## 2020-12-16 MED ORDER — SODIUM CHLORIDE 0.9 % IR SOLN
Status: DC | PRN
  Administered 2020-12-16: 1

## 2020-12-16 MED ORDER — CEFAZOLIN SODIUM-DEXTROSE 2-4 GM/100ML-% IV SOLN
2.0000 g | INTRAVENOUS | Status: AC
  Administered 2020-12-16: 2 g via INTRAVENOUS

## 2020-12-16 MED ORDER — SIMETHICONE 80 MG PO CHEW: 80.0000 mg | CHEWABLE_TABLET | ORAL | Status: DC | PRN

## 2020-12-16 SURGICAL SUPPLY — 34 items
APL SKNCLS STERI-STRIP NONHPOA (GAUZE/BANDAGES/DRESSINGS) ×1
BENZOIN TINCTURE PRP APPL 2/3 (GAUZE/BANDAGES/DRESSINGS) ×2 IMPLANT
CHLORAPREP W/TINT 26ML (MISCELLANEOUS) ×2 IMPLANT
CLAMP CORD UMBIL (MISCELLANEOUS) IMPLANT
CLOTH BEACON ORANGE TIMEOUT ST (SAFETY) ×2 IMPLANT
DRSG OPSITE POSTOP 4X10 (GAUZE/BANDAGES/DRESSINGS) ×2 IMPLANT
ELECT REM PT RETURN 9FT ADLT (ELECTROSURGICAL) ×2
ELECTRODE REM PT RTRN 9FT ADLT (ELECTROSURGICAL) ×1 IMPLANT
EXTRACTOR VACUUM M CUP 4 TUBE (SUCTIONS) IMPLANT
GLOVE BIOGEL PI IND STRL 7.0 (GLOVE) ×2 IMPLANT
GLOVE BIOGEL PI IND STRL 7.5 (GLOVE) ×2 IMPLANT
GLOVE BIOGEL PI INDICATOR 7.0 (GLOVE) ×2
GLOVE BIOGEL PI INDICATOR 7.5 (GLOVE) ×2
GLOVE ECLIPSE 7.5 STRL STRAW (GLOVE) ×2 IMPLANT
GOWN STRL REUS W/TWL LRG LVL3 (GOWN DISPOSABLE) ×6 IMPLANT
HEMOSTAT ARISTA ABSORB 3G PWDR (HEMOSTASIS) ×1 IMPLANT
KIT ABG SYR 3ML LUER SLIP (SYRINGE) IMPLANT
NDL HYPO 25X5/8 SAFETYGLIDE (NEEDLE) IMPLANT
NEEDLE HYPO 25X5/8 SAFETYGLIDE (NEEDLE) IMPLANT
NS IRRIG 1000ML POUR BTL (IV SOLUTION) ×2 IMPLANT
PACK C SECTION WH (CUSTOM PROCEDURE TRAY) ×2 IMPLANT
PAD OB MATERNITY 4.3X12.25 (PERSONAL CARE ITEMS) ×2 IMPLANT
PENCIL SMOKE EVAC W/HOLSTER (ELECTROSURGICAL) ×2 IMPLANT
RTRCTR C-SECT PINK 25CM LRG (MISCELLANEOUS) ×2 IMPLANT
STRIP CLOSURE SKIN 1/2X4 (GAUZE/BANDAGES/DRESSINGS) ×2 IMPLANT
SUT PLAIN 0 NONE (SUTURE) ×2 IMPLANT
SUT VIC AB 0 CT1 36 (SUTURE) ×2 IMPLANT
SUT VIC AB 2-0 CT1 (SUTURE) ×4 IMPLANT
SUT VIC AB 2-0 CT1 27 (SUTURE) ×2
SUT VIC AB 2-0 CT1 TAPERPNT 27 (SUTURE) ×1 IMPLANT
SUT VIC AB 4-0 KS 27 (SUTURE) ×2 IMPLANT
TOWEL OR 17X24 6PK STRL BLUE (TOWEL DISPOSABLE) ×2 IMPLANT
TRAY FOLEY W/BAG SLVR 14FR LF (SET/KITS/TRAYS/PACK) ×2 IMPLANT
WATER STERILE IRR 1000ML POUR (IV SOLUTION) ×2 IMPLANT

## 2020-12-16 NOTE — H&P (Addendum)
OBSTETRIC ADMISSION HISTORY AND PHYSICAL  Deanna Cooper is a 20 y.o. female G2P1001 with IUP at [redacted]w[redacted]d by L/7 presenting for scheduled Cesarean delivery with BTL (consent signed 10/15/20). She reports +FMs, No LOF, no VB, no blurry vision, headaches or peripheral edema, and RUQ pain.  She plans on breast feeding. She requests Nexplanon for birth control. She received her prenatal care at  [facility]    Dating: By L/7 --->  Estimated Date of Delivery: 01/06/21  Sono: @[redacted]w[redacted]d , CWD, normal anatomy, cephalic presentation, 1954g, EFW  Prenatal History/Complications:  - IUGR (EFW 2.6% with elevated Dopplers) - H/o Cesarean x1 (secondary to placental abruption) - H/o chlamydia in pregnancy (negative TOC)  Past Medical History: Past Medical History:  Diagnosis Date   Chlamydia infection affecting pregnancy in first trimester, antepartum 05/19/2017   Dx 04/26/17 > treated TOC 06/02/17 > negative   Hyperemesis affecting pregnancy, antepartum    Learning disability    Dx age 47 yo   Nexplanon insertion 11/07/2017   Positive GBS test 10/24/2017   Supervision of normal first pregnancy, antepartum 04/21/2017    Clinic  [facility] Prenatal Labs Dating  11 wk ultrasound Blood type: AB/Positive/-- (11/16 1442)  Genetic Screen 1 Screen:    AFP:     Quad:     NIPS:obtained 06/02/17 > negative (female) Antibody:Negative (11/16 1442) Anatomic 09-10-1999  Normal Rubella: 8.71 (11/16 1442) GTT Early:               Third trimester: 69-113-89 RPR: Non Reactive (03/22 1542)  Flu vaccine  07/14/17 HBsAg: Negative (11/16 144   Supervision of normal first teen pregnancy, unspecified trimester 04/21/2017   Refer to [facility] > enrolled, class every Thursday; Volunteer Doula   Vaginal Pap smear, abnormal     Past Surgical History: Past Surgical History:  Procedure Laterality Date   CESAREAN SECTION N/A 11/05/2017   Procedure: CESAREAN SECTION;  Surgeon: 01/05/2018, MD;  Location: [facility] BIRTHING SUITES;  Service:  Obstetrics;  Laterality: N/A;    Obstetrical History: OB History     Gravida  2   Para  1   Term  1   Preterm      AB      Living  1      SAB      IAB      Ectopic      Multiple  0   Live Births  1           Social History Social History   Socioeconomic History   Marital status: Single    Spouse name: Not on file   Number of children: 1   Years of education: Not on file   Highest education level: High school graduate  Occupational History   Occupation: Unemployed  Tobacco Use   Smoking status: Never   Smokeless tobacco: Never  Vaping Use   Vaping Use: Never used  Substance and Sexual Activity   Alcohol use: No   Drug use: No   Sexual activity: Yes    Partners: Male    Birth control/protection: None  Other Topics Concern   Not on file  Social History Narrative   Not on file   Social Determinants of Health   Financial Resource Strain: Not on file  Food Insecurity: No Food Insecurity   Worried About Running Out of Food in the Last Year: Never true   Ran Out of Food in the Last Year: Never true  Transportation Needs: No Transportation  Needs   Lack of Transportation (Medical): No   Lack of Transportation (Non-Medical): No  Physical Activity: Not on file  Stress: No Stress Concern Present   Feeling of Stress : Not at all  Social Connections: Not on file    Family History: Family History  Problem Relation Age of Onset   Diabetes Mother    Hypertension Mother    Hyperlipidemia Mother    Thalassemia Brother    Sickle cell anemia Brother     Allergies: No Known Allergies  Medications Prior to Admission  Medication Sig Dispense Refill Last Dose   ondansetron (ZOFRAN ODT) 8 MG disintegrating tablet Take 1 tablet (8 mg total) by mouth every 8 (eight) hours as needed for nausea or vomiting. 30 tablet 1 12/16/2020 at 0820   acetaminophen (TYLENOL) 500 MG tablet Take 2 tablets (1,000 mg total) by mouth every 8 (eight) hours as needed for  mild pain, moderate pain or headache. 30 tablet 0    Blood Pressure Monitoring (BLOOD PRESSURE MONITOR AUTOMAT) DEVI 1 Device by Does not apply route daily. Automatic blood pressure cuff regular size. To monitor blood pressure regularly at home. ICD-10 code:Z34.90 (Patient not taking: No sig reported) 1 each 0    loratadine (CLARITIN) 10 MG tablet Take 1 tablet (10 mg total) by mouth daily. (Patient not taking: No sig reported) 30 tablet 5 Not Taking   metroNIDAZOLE (FLAGYL) 500 MG tablet Take 1 tablet (500 mg total) by mouth 2 (two) times daily. Take with Food. (Patient not taking: No sig reported) 14 tablet 0 Not Taking   Misc. Devices (GOJJI WEIGHT SCALE) MISC 1 Device by Does not apply route daily as needed. To weight self daily as needed at home. ICD-10 code: Z34.90 (Patient not taking: No sig reported) 1 each 0    promethazine (PHENERGAN) 25 MG tablet Take 1 tablet (25 mg total) by mouth every 6 (six) hours as needed for nausea or vomiting. (Patient not taking: No sig reported) 30 tablet 2 Not Taking   ranitidine (ZANTAC) 150 MG capsule Take 1 capsule (150 mg total) by mouth daily. (Patient not taking: No sig reported) 30 capsule 0 Not Taking   scopolamine (TRANSDERM-SCOP) 1 MG/3DAYS Place 1 patch (1.5 mg total) onto the skin every 3 (three) days. (Patient not taking: No sig reported) 10 patch 12 Not Taking   terconazole (TERAZOL 7) 0.4 % vaginal cream Place 1 applicator vaginally at bedtime for 7 days. (Patient not taking: No sig reported) 45 g 0 Not Taking     Review of Systems   All systems reviewed and negative except as stated in HPI  Blood pressure 121/75, pulse 74, temperature 98.4 F (36.9 C), temperature source Oral, resp. rate 18, height 5\' 1"  (1.549 m), weight 56.7 kg, last menstrual period 04/01/2020. General appearance: alert, cooperative, and appears stated age Lungs: normal WOB Heart: regular rate  Abdomen: soft, non-tender Extremities: no sign of DVT Presentation:  cephalic Fetal monitoring: 130 on Dopplers    Prenatal labs: ABO, Rh: --/--/AB POS (07/11 1228) Antibody: NEG (07/11 1228) Rubella: 5.82 (01/18 1041) RPR: NON REACTIVE (07/11 1225)  HBsAg: Negative (01/18 1041)  HIV: Non Reactive (05/12 0805)  GBS: Negative/-- (07/06 1133)  2 hr Glucola wnl Genetic screening wnl Anatomy 09-25-1993 wnl except for FGR  Prenatal Transfer Tool  Maternal Diabetes: No Genetic Screening: Normal Maternal Ultrasounds/Referrals: IUGR Fetal Ultrasounds or other Referrals:  Referred to Materal Fetal Medicine  Maternal Substance Abuse:  No Significant Maternal Medications:  None  Significant Maternal Lab Results: Group B Strep negative  No results found for this or any previous visit (from the past 24 hour(s)).  Patient Active Problem List   Diagnosis Date Noted   History of cesarean section 12/16/2020   Pregnancy affected by fetal growth restriction 12/09/2020   Nausea and vomiting during pregnancy 09/17/2020   Chlamydia trachomatis infection in mother during second trimester of pregnancy 09/09/2020   Supervision of other normal pregnancy, antepartum 06/23/2020   Vaginal bleeding in pregnancy, third trimester 11/05/2017    Assessment/Plan:  Deanna Cooper is a 20 y.o. G2P1001 at [redacted]w[redacted]d here for scheduled elective repeat Cesarean.  #Scheduled Elective Repeat Cesarean at 37 wks for IUGR. #Pain: Per anesthesia #FWB: FHT wnl on Dopplers #ID: GBS negative #MOF: breast and formula #MOC: inpatient Nexplanon #Circ: n/a  Sheila Oats, MD OB Fellow, Faculty Practice 12/16/2020 10:53 AM  Attestation of Attending Supervision of Provider:  Evaluation and management procedures were performed by this provider under my supervision and collaboration. I have reviewed the provider's note and chart, and I agree with the management and plan.   Shonna Chock, MD Faculty Practice, [facility]

## 2020-12-16 NOTE — Anesthesia Procedure Notes (Signed)
Spinal  Patient location during procedure: OR Start time: 12/16/2020 11:38 AM End time: 12/16/2020 11:43 AM Reason for block: surgical anesthesia Staffing Performed: anesthesiologist  Anesthesiologist: Mal Amabile, MD Preanesthetic Checklist Completed: patient identified, IV checked, site marked, risks and benefits discussed, surgical consent, monitors and equipment checked, pre-op evaluation and timeout performed Spinal Block Patient position: sitting Prep: DuraPrep and site prepped and draped Patient monitoring: heart rate, cardiac monitor, continuous pulse ox and blood pressure Approach: midline Location: L3-4 Injection technique: single-shot Needle Needle type: Pencan  Needle gauge: 24 G Needle length: 9 cm Needle insertion depth: 6 cm Assessment Sensory level: T4 Events: CSF return Additional Notes Patient tolerated procedure well. Adequate sensory level.

## 2020-12-16 NOTE — Discharge Summary (Addendum)
Postpartum Discharge Summary  Date of Service updated: 12/18/2020     Patient Name: Deanna Cooper DOB: 2000/07/01 MRN: [id number]  Date of admission: 12/16/2020 Delivery date:12/16/2020  Delivering provider: Laurey Arrow BEDFORD  Date of discharge: 12/18/2020  Admitting diagnosis: History of cesarean section [Z98.891] Intrauterine pregnancy: [redacted]w[redacted]d    Secondary diagnosis:  Active Problems:   Chlamydia trachomatis infection in mother during second trimester of pregnancy   Pregnancy affected by fetal growth restriction   History of cesarean section   Cesarean delivery delivered  Additional problems: as noted above  Discharge diagnosis: Elective Repeat Cesarean delivery delivered                                  Post partum procedures:   Nexplanon Given PP Augmentation: N/A Complications: None  Hospital course: Sceduled C/S   20y.o. yo G2P2002 at 345w0das admitted to the hospital 12/16/2020 for scheduled cesarean section with the following indication:Elective Repeat, Severe FGR. Delivery details are as follows:  Membrane Rupture Time/Date: 12:08 PM ,12/16/2020   Delivery Method:C-Section, Low Transverse  Details of operation can be found in separate operative note.  Patient had an uncomplicated postpartum course.  She is ambulating, tolerating a regular diet, passing flatus, and urinating well. Patient is discharged home in stable condition on  12/18/20        Newborn Data: Birth date:12/16/2020  Birth time:12:08 PM  Gender:Female  Living status:Living  Apgars:7 ,9  Weight:2210 g     Magnesium Sulfate received: No BMZ received: No Rhophylac:N/A MMR:N/A T-DaP: Refused offered prior to discharge Flu: N/Aoffered prior to discharge Transfusion:No  Physical exam  Vitals:   12/17/20 1030 12/17/20 1455 12/17/20 2113 12/18/20 0500  BP: 125/66 107/80 122/79 120/68  Pulse: 79 90 82 67  Resp: 18 17 16 16   Temp: 98.4 F (36.9 C) 97.9 F (36.6 C) 98.6 F (37 C)   TempSrc: Oral  Oral Oral Oral  SpO2: 99% 98% 100% 99%  Weight:      Height:       General: alert, cooperative, and no distress Lochia: appropriate Uterine Fundus: firm Incision: Dressing is clean, dry, and intact DVT Evaluation: No evidence of DVT seen on physical exam. Negative Homan's sign. No cords or calf tenderness. Labs: Lab Results  Component Value Date   WBC 9.0 12/17/2020   HGB 8.7 (L) 12/17/2020   HCT 25.7 (L) 12/17/2020   MCV 88.9 12/17/2020   PLT 293 12/17/2020   CMP Latest Ref Rng & Units 10/31/2020  Glucose 70 - 99 mg/dL 93  BUN 6 - 20 mg/dL <5(L)  Creatinine 0.44 - 1.00 mg/dL 0.57  Sodium 135 - 145 mmol/L 133(L)  Potassium 3.5 - 5.1 mmol/L 3.8  Chloride 98 - 111 mmol/L 103  CO2 22 - 32 mmol/L 25  Calcium 8.9 - 10.3 mg/dL 8.9  Total Protein 6.5 - 8.1 g/dL 6.4(L)  Total Bilirubin 0.3 - 1.2 mg/dL 0.6  Alkaline Phos 38 - 126 U/L 88  AST 15 - 41 U/L 19  ALT 0 - 44 U/L 10   Edinburgh Score: Edinburgh Postnatal Depression Scale Screening Tool 12/16/2020  I have been able to laugh and see the funny side of things. 0  I have looked forward with enjoyment to things. 1  I have blamed myself unnecessarily when things went wrong. 1  I have been anxious or worried for no good reason. 2  I have felt scared or panicky for no good reason. 1  Things have been getting on top of me. 1  I have been so unhappy that I have had difficulty sleeping. 1  I have felt sad or miserable. 1  I have been so unhappy that I have been crying. 1  The thought of harming myself has occurred to me. 1  Edinburgh Postnatal Depression Scale Total 10     After visit meds:  Allergies as of 12/18/2020   No Known Allergies      Medication List     STOP taking these medications    Blood Pressure Monitor Automat Devi   Gojji Weight Scale Misc   loratadine 10 MG tablet Commonly known as: Claritin   metroNIDAZOLE 500 MG tablet Commonly known as: FLAGYL   ondansetron 8 MG disintegrating  tablet Commonly known as: Zofran ODT   promethazine 25 MG tablet Commonly known as: PHENERGAN   ranitidine 150 MG capsule Commonly known as: ZANTAC   scopolamine 1 MG/3DAYS Commonly known as: TRANSDERM-SCOP   terconazole 0.4 % vaginal cream Commonly known as: Terazol 7       TAKE these medications    acetaminophen 325 MG tablet Commonly known as: Tylenol Take 2 tablets (650 mg total) by mouth every 6 (six) hours as needed. What changed:  medication strength how much to take when to take this reasons to take this   coconut oil Oil Apply 1 application topically as needed.   ferrous sulfate 325 (65 FE) MG tablet Take 1 tablet (325 mg total) by mouth 2 (two) times daily with a meal.   ibuprofen 600 MG tablet Commonly known as: ADVIL Take 1 tablet (600 mg total) by mouth every 6 (six) hours.   oxyCODONE 5 MG immediate release tablet Commonly known as: Oxy IR/ROXICODONE Take 1 tablet (5 mg total) by mouth every 4 (four) hours as needed for moderate pain.   prenatal multivitamin Tabs tablet Take 1 tablet by mouth daily at 12 noon.         Discharge home in stable condition Infant Feeding: Breast Infant Disposition:home with mother Discharge instruction: per After Visit Summary and Postpartum booklet. Activity: Advance as tolerated. Pelvic rest for 6 weeks.  Diet: routine diet Given PO Iron for Hgb 8.7> pt refused IV venofer  Future Appointments: Future Appointments  Date Time Provider [place]  12/23/2020  3:30 PM [place] None  01/28/2021  3:50 PM Laury Deep, CNM [facility] None   Follow up Visit: Message sent to [facility] by [facility]  Please schedule this patient for a In person postpartum visit in 6 weeks with the following provider: Any provider. Additional Postpartum F/U:Incision check 1 week  High risk pregnancy complicated by:  Severe IUGR, h/o chlamydia (negative TOC), now h/o CS x2 Delivery mode:  C-Section, Low  Transverse  Anticipated Birth Control:  plan for PP Nexplanon   12/18/2020 Erskine Emery, MD  I personally saw and evaluated the patient, performing the key elements of the service. I developed and verified the management plan that is described in the resident's/student's note, and I agree with the content with my edits above. VSS, HRR&R, Resp unlabored, Legs neg.  Nigel Berthold, CNM 12/19/2020 8:13 AM

## 2020-12-16 NOTE — Progress Notes (Signed)
RN attempted to get axillary and oral temperature again. Bedside thermometer unable to read temperature. CRNA at bedside with temporal thermometer. Second Lawyer and blanket applied to patient. CRNA aware.

## 2020-12-16 NOTE — Op Note (Signed)
[name] PROCEDURE DATE: 12/16/2020  PREOPERATIVE DIAGNOSES: Intrauterine pregnancy at [redacted]w[redacted]d weeks gestation;  elective repeat given h/o Cesarean delivery x1 , Severe Fetal Growth Restriction  POSTOPERATIVE DIAGNOSES: The same  PROCEDURE: Repeat Low Transverse Cesarean Section  SURGEON:  Dr. Shonna Chock, MD  ASSISTANT:  Dr. Lynnda Shields, MD & Dr. Mart Piggs, MD  ANESTHESIOLOGY TEAM: Anesthesiologist: Mal Amabile, MD CRNA: Lucinda Dell, CRNA; Tressia Miners, CRNA  INDICATIONS: Deanna Cooper is a 20 y.o. G2P1001 at [redacted]w[redacted]d here for cesarean section secondary to the indications listed under preoperative diagnoses; please see preoperative note for further details.  The risks of surgery were discussed with the patient including but were not limited to: bleeding which may require transfusion or reoperation; infection which may require antibiotics; injury to bowel, bladder, ureters or other surrounding organs; injury to the fetus; need for additional procedures including hysterectomy in the event of a life-threatening hemorrhage; formation of adhesions; placental abnormalities wth subsequent pregnancies; incisional problems; thromboembolic phenomenon and other postoperative/anesthesia complications.  The patient concurred with the proposed plan, giving informed written consent for the procedure.    FINDINGS:  Viable female infant in cephalic presentation.  Apgars 7 and 9.  Clear amniotic fluid.  Intact placenta, three vessel cord.  Normal uterus, fallopian tubes and ovaries bilaterally.  ANESTHESIA: Spinal INTRAVENOUS FLUIDS: 1,000 ml   ESTIMATED BLOOD LOSS: 192 ml URINE OUTPUT:  200 ml SPECIMENS: Placenta sent to L&D COMPLICATIONS: None immediate  PROCEDURE IN DETAIL: The patient preoperatively received intravenous antibiotics and had sequential compression devices applied to her lower extremities.  She was then taken to the operating room where spinal anesthesia was  administered and was found to be adequate. She was then placed in a dorsal supine position with a leftward tilt, and prepped and draped in a sterile manner.  A foley catheter was placed into her bladder and attached to constant gravity.  After an adequate timeout was performed, a Pfannenstiel skin incision was made with scalpel on her preexisting scar and carried through to the underlying layer of fascia. The fascia was incised in the midline, and this incision was extended bilaterally bluntly and with use of Mayo scissors.  Kocher clamps were applied to the superior aspect of the fascial incision and the underlying rectus muscles were dissected off bluntly and sharply.  A similar process was carried out on the inferior aspect of the fascial incision. The rectus muscles were separated in the midline and the peritoneum was entered bluntly. The Alexis self-retaining retractor was introduced into the abdominal cavity.  Attention was turned to the lower uterine segment where a low transverse hysterotomy was made with a scalpel and extended bilaterally bluntly.  The infant was successfully delivered, the cord was clamped and cut after one minute, and the infant was handed over to the awaiting neonatology team. Uterine massage was then administered, and the placenta delivered intact with a three-vessel cord. The uterus was then cleared of clots and debris.  The hysterotomy was closed with 0 Vicryl in a running locked fashion, and a horizontal imbricating layer was also placed with 0 Vicryl.  The pelvis was irrigated and cleared of all clot and debris. Hemostasis was confirmed on all surfaces. Arista was applied along the hysterotomy site. The retractor was removed.  The peritoneum was closed with a 0 Vicryl running stitch. The fascia was then closed using 0 Vicryl in a running fashion.  The subcutaneous layer was irrigated, and the skin was closed with a 4-0 Vicryl subcuticular  stitch. The patient tolerated the procedure  well. Sponge, instrument and needle counts were correct x 3.  She was taken to the recovery room in stable condition.   Sheila Oats, MD OB Fellow, Faculty Practice 12/16/2020 12:43 PM

## 2020-12-16 NOTE — Anesthesia Preprocedure Evaluation (Signed)
Anesthesia Evaluation  Patient identified by MRN, date of birth, ID band Patient awake    Reviewed: Allergy & Precautions, NPO status , Patient's Chart, lab work & pertinent test results  Airway Mallampati: II  TM Distance: >3 FB Neck ROM: Full    Dental no notable dental hx. (+) Teeth Intact   Pulmonary neg pulmonary ROS,    Pulmonary exam normal breath sounds clear to auscultation       Cardiovascular negative cardio ROS Normal cardiovascular exam Rhythm:Regular Rate:Normal     Neuro/Psych PSYCHIATRIC DISORDERS Learning disabilitynegative neurological ROS     GI/Hepatic negative GI ROS, Neg liver ROS,   Endo/Other  negative endocrine ROS  Renal/GU negative Renal ROS  negative genitourinary   Musculoskeletal negative musculoskeletal ROS (+)   Abdominal (+) - obese,   Peds  Hematology  (+) anemia ,   Anesthesia Other Findings   Reproductive/Obstetrics (+) Pregnancy Previous C/Section Desires sterilization                             Anesthesia Physical Anesthesia Plan  ASA: 2  Anesthesia Plan: Spinal   Post-op Pain Management:    Induction:   PONV Risk Score and Plan: 4 or greater and Treatment may vary due to age or medical condition and Scopolamine patch - Pre-op  Airway Management Planned: Natural Airway  Additional Equipment:   Intra-op Plan:   Post-operative Plan:   Informed Consent: I have reviewed the patients History and Physical, chart, labs and discussed the procedure including the risks, benefits and alternatives for the proposed anesthesia with the patient or authorized representative who has indicated his/her understanding and acceptance.     Dental advisory given  Plan Discussed with: CRNA and Anesthesiologist  Anesthesia Plan Comments:         Anesthesia Quick Evaluation

## 2020-12-16 NOTE — Transfer of Care (Signed)
Immediate Anesthesia Transfer of Care Note  Patient: Deanna Cooper  Procedure(s) Performed: CESAREAN SECTION  Patient Location: PACU  Anesthesia Type:Spinal  Level of Consciousness: awake, alert , oriented and patient cooperative  Airway & Oxygen Therapy: Patient Spontanous Breathing  Post-op Assessment: Report given to RN and Post -op Vital signs reviewed and stable  Post vital signs: Reviewed and stable  Last Vitals:  Vitals Value Taken Time  BP 106/78 12/16/20 1250  Temp    Pulse 60 12/16/20 1254  Resp 15 12/16/20 1254  SpO2 100 % 12/16/20 1254  Vitals shown include unvalidated device data.  Last Pain:  Vitals:   12/16/20 1002  TempSrc: Oral  PainSc: 6       Patients Stated Pain Goal: 5 (12/16/20 1002)  Complications: No notable events documented.

## 2020-12-16 NOTE — Progress Notes (Signed)
Shortly after patient arrived to [facility] she started complaining of itching and nausea. Assisted mother with breastfeeding her baby, while baby was breastfeeding, mother vomited 150 of clear fluid. Zofran given at 1532 via IV as per orders and Benadryl IV given for itching.  At this time of 1715, pt states relief, but is still having some itching. Educated patient on  side effects of anesthesia medication and reassured the itching will get better.

## 2020-12-16 NOTE — Progress Notes (Signed)
Patient skin cool to touch. Unable to obtain temperature orally or bilateral axillary. Attempted with two thermometers. Called CRNA to bedside. CRNA evaluated and obtain temporal temperature of 95.4 x2. Bair hugger applied, three warm blankets put closest to patient skin.

## 2020-12-16 NOTE — Anesthesia Postprocedure Evaluation (Signed)
Anesthesia Post Note  Patient: Deanna Cooper  Procedure(s) Performed: CESAREAN SECTION     Patient location during evaluation: PACU Anesthesia Type: Spinal Level of consciousness: oriented and awake and alert Pain management: pain level controlled Vital Signs Assessment: post-procedure vital signs reviewed and stable Respiratory status: spontaneous breathing, respiratory function stable and nonlabored ventilation Cardiovascular status: blood pressure returned to baseline and stable Postop Assessment: no headache, no backache, no apparent nausea or vomiting, spinal receding and patient able to bend at knees Anesthetic complications: no   No notable events documented.  Last Vitals:  Vitals:   12/16/20 1345 12/16/20 1400  BP: 104/68 108/79  Pulse: 71 65  Resp: 11 20  Temp: (S) (!) 35.5 C (!) 35.8 C  SpO2: 99% 97%    Last Pain:  Vitals:   12/16/20 1400  TempSrc: Temporal  PainSc: 0-No pain   Pain Goal: Patients Stated Pain Goal: 5 (12/16/20 1002)  LLE Motor Response: Purposeful movement (12/16/20 1400) LLE Sensation: Tingling (12/16/20 1400) RLE Motor Response: Purposeful movement (12/16/20 1400) RLE Sensation: Tingling (12/16/20 1400)     Epidural/Spinal Function Cutaneous sensation: Tingles (12/16/20 1400), Patient able to flex knees: Yes (12/16/20 1400), Patient able to lift hips off bed: No (12/16/20 1400), Back pain beyond tenderness at insertion site: No (12/16/20 1400), Progressively worsening motor and/or sensory loss: No (12/16/20 1400), Bowel and/or bladder incontinence post epidural: No (12/16/20 1400), Name of anesthesiologist notified: n (12/16/20 1400)  Kaid Seeberger A.

## 2020-12-16 NOTE — Lactation Note (Addendum)
This note was copied from a baby's chart. Lactation Consultation Note  Patient Name: Deanna Cooper [id number] Date: 12/16/2020 Reason for consult: Initial assessment;Early term 37-38.6wks;Infant < 6lbs Age:22 hours  LC in to visit with P2 Mom of ET infant weighing 4 lbs14oz at birth.  First CBG was 63.    Baby has been to breast twice with a deep latch and swallows identified.  Mom assisted with positioning.  Baby fed on and off for 60 mins STS.  Baby latched for fed for LC for 10 mins with deep jaw extensions and swallows.  Baby looked very vigorous on the breast.   Mom hand expressed 3 ml into colostrum container.  Baby too sleepy to even suckle on Mom's finger to syringe feed colostrum to baby. Mom aware of the 4 hr limit to EBM at room temperature.  LC set up DEBP at bedside and offered to assist with first pumping, but Mom wasn't feeling well and asked to wait.  Mom cramping from baby breastfeeding.  Reviewed process and cleaning of parts with FOB and Mom.  RN to assist when Mom is ready.  Plan- 1- Keep baby STS as much as possible 2- Offer breast with cues, with goal of 8-12 times per 24 hrs. 3- Awaken baby after 3 hrs and encourage baby to latch 4- If baby is too sleepy to latch after 3 hrs, supplement with EBM per volume guidelines adding donor breast milk or formula as needed. 5- Pump both breasts after breastfeeding to support milk supply.  Maternal Data Has patient been taught Hand Expression?: Yes Does the patient have breastfeeding experience prior to this delivery?: Yes How long did the patient breastfeed?: 6 months  Feeding Mother's Current Feeding Choice: Breast Milk  LATCH Score Latch: Grasps breast easily, tongue down, lips flanged, rhythmical sucking.  Audible Swallowing: Spontaneous and intermittent  Type of Nipple: Everted at rest and after stimulation  Comfort (Breast/Nipple): Soft / non-tender  Hold (Positioning): Assistance needed to correctly position  infant at breast and maintain latch.  LATCH Score: 9   Lactation Tools Discussed/Used Tools: Pump;Flanges Flange Size: 24 Breast pump type: Double-Electric Breast Pump Pump Education: Setup, frequency, and cleaning;Milk Storage Reason for Pumping: Support milk supply/<5 lbs Pumping frequency: Encouraged Mom to pump every 3 hrs  Interventions Interventions: Breast feeding basics reviewed;Assisted with latch;Skin to skin;Breast massage;Hand express;Adjust position;Support pillows;Position options;DEBP;Education  Discharge WIC Program: Yes  Consult Status Consult Status: Follow-up Date: 12/17/20 Follow-up type: In-patient    Judee Clara 12/16/2020, 4:38 PM

## 2020-12-17 ENCOUNTER — Encounter (HOSPITAL_COMMUNITY): Payer: Self-pay | Admitting: Obstetrics and Gynecology

## 2020-12-17 DIAGNOSIS — Z30017 Encounter for initial prescription of implantable subdermal contraceptive: Secondary | ICD-10-CM

## 2020-12-17 LAB — CBC
HCT: 25.7 % — ABNORMAL LOW (ref 36.0–46.0)
Hemoglobin: 8.7 g/dL — ABNORMAL LOW (ref 12.0–15.0)
MCH: 30.1 pg (ref 26.0–34.0)
MCHC: 33.9 g/dL (ref 30.0–36.0)
MCV: 88.9 fL (ref 80.0–100.0)
Platelets: 293 10*3/uL (ref 150–400)
RBC: 2.89 MIL/uL — ABNORMAL LOW (ref 3.87–5.11)
RDW: 12.1 % (ref 11.5–15.5)
WBC: 9 10*3/uL (ref 4.0–10.5)
nRBC: 0 % (ref 0.0–0.2)

## 2020-12-17 MED ORDER — ETONOGESTREL 68 MG ~~LOC~~ IMPL
68.0000 mg | DRUG_IMPLANT | Freq: Once | SUBCUTANEOUS | Status: AC
  Administered 2020-12-17: 68 mg via SUBCUTANEOUS
  Filled 2020-12-17: qty 1

## 2020-12-17 MED ORDER — FERROUS SULFATE 325 (65 FE) MG PO TABS: 325.0000 mg | ORAL_TABLET | Freq: Every day | ORAL | Status: DC

## 2020-12-17 MED ORDER — IBUPROFEN 600 MG PO TABS
600.0000 mg | ORAL_TABLET | Freq: Four times a day (QID) | ORAL | Status: DC
  Administered 2020-12-17 – 2020-12-18 (×5): 600 mg via ORAL
  Filled 2020-12-17 (×4): qty 1

## 2020-12-17 MED ORDER — IBUPROFEN 600 MG PO TABS: 600.0000 mg | ORAL_TABLET | Freq: Four times a day (QID) | ORAL | Status: DC

## 2020-12-17 MED ORDER — SODIUM CHLORIDE 0.9 % IV SOLN
500.0000 mg | Freq: Once | INTRAVENOUS | Status: DC
  Administered 2020-12-17: 500 mg via INTRAVENOUS
  Filled 2020-12-17: qty 25

## 2020-12-17 MED ORDER — LIDOCAINE HCL 1 % IJ SOLN
0.0000 mL | Freq: Once | INTRAMUSCULAR | Status: AC | PRN
  Administered 2020-12-17: 5 mL via INTRADERMAL
  Filled 2020-12-17: qty 20

## 2020-12-17 MED ORDER — FERROUS SULFATE 325 (65 FE) MG PO TABS
325.0000 mg | ORAL_TABLET | Freq: Every day | ORAL | Status: DC
  Administered 2020-12-17 – 2020-12-18 (×2): 325 mg via ORAL
  Filled 2020-12-17 (×2): qty 1

## 2020-12-17 NOTE — Progress Notes (Signed)
Received TC from nurse that she had tried to infuse Venfor twice at two different IV sites and patient refused. Patient would like to do PO Iron instead; RX for PO iron given.

## 2020-12-17 NOTE — Procedures (Addendum)
   Post-Placental Nexplanon Insertion Procedure Note  Patient was identified. Informed consent was signed, signed copy in chart. A time-out was performed.    The insertion site was identified 8-10 cm (3-4 inches) from the medial epicondyle of the humerus and 3-5 cm (1.25-2 inches) posterior to (below) the sulcus (groove) between the biceps and triceps muscles of the patient's left arm and marked. The site was prepped and draped in the usual sterile fashion. Pt was prepped with alcohol swab and then injected with 5 cc of 1% lidocaine. The site was prepped with betadine. Nexplanon removed form packaging,  Device confirmed in needle, then inserted full length of needle and withdrawn per handbook instructions. Provider and patient verified presence of the implant in the woman's arm by palpation. Pt insertion site was covered with steristrips/adhesive bandage and pressure bandage. There was minimal blood loss. Patient tolerated procedure well.  Patient was given post procedure instructions and Nexplanon user card with expiration date. Condoms were recommended for STI prevention. Patient was asked to keep the pressure dressing on for 24 hours to minimize bruising and keep the adhesive bandage on for 3-5 days. The patient verbalized understanding of the plan of care and agrees.   Lot # [id number] Expiration Date 12/25/2022  Attestation of Supervision of Student:  I confirm that I have verified the information documented in the  resident  student's note and that I have also personally reperformed the history, physical exam and all medical decision making activities.  I have verified that all services and findings are accurately documented in this student's note; and I agree with management and plan as outlined in the documentation. I have also made any necessary editorial changes.   Marylene Land, CNM [facility] [employer], [facility] 12/17/2020 3:17 PM

## 2020-12-17 NOTE — Progress Notes (Signed)
POD# 1 rLTCS Subjective: Patient is doing well without complaints. Ambulating without difficulty. Voiding and passing flatus. Tolerating PO. Abdominal pain improved. Vaginal bleeding decreased.  Objective: Blood pressure 106/61, pulse 86, temperature 98.5 F (36.9 C), temperature source Oral, resp. rate 18, height 5\' 1"  (1.549 m), weight 56.7 kg, last menstrual period 04/01/2020, SpO2 99 %, unknown if currently breastfeeding.  Physical Exam:  General: alert, cooperative, and no distress Lochia: appropriate Uterine Fundus: firm Incision: pressure dressing c/d/i DVT Evaluation: No evidence of DVT seen on physical exam.  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   LOS: 1 day   02/14/21 12/17/2020, 6:44 AM

## 2020-12-17 NOTE — Clinical Social Work Maternal (Addendum)
CLINICAL SOCIAL WORK MATERNAL/CHILD NOTE  Patient Details  Name: Deanna Cooper MRN: [id number] Date of Birth: December 19, 2000  Date:  12/17/2020  Clinical Social Worker Initiating Note:  Kathrin Greathouse, [place] Date/Time: Initiated:  12/17/20/1614     Child's Name:  Deanna Cooper   Biological Parents:  Mother, Father (FOB: Deanna Cooper)   Need for Interpreter:  None   Reason for Referral:  Current Substance Use/Substance Use During Pregnancy  , Behavioral Health Concerns, Other (Comment) Flavia Shipper)   Address:  [place] [address]    Phone number:  [phone] (home)     Additional phone number:   Household Members/Support Persons (HM/SP):   Household Member/Support Person 1, Household Member/Support Person 2, Household Member/Support Person 3, Household Member/Support Person 4, Household Member/Support Person 5   HM/SP Name Relationship DOB or Age  HM/SP -[place] Significant other 11-20-1997  HM/SP -[place] father in law    HM/SP -[place] Mother in Sports coach    HM/SP -[place] in Sports coach    HM/SP -[place] Daughter 11-05-17  HM/SP -6        HM/SP -7        HM/SP -8          Natural Supports (not living in the home):  Extended Family   Professional Supports: None   Employment: Unemployed   Type of Work:     Education:  Programmer, systems   Homebound arranged:    Museum/gallery curator Resources:  Medicaid   Other Resources:  Physicist, medical  , [place] Considerations Which May Impact Care:    Strengths:  Ability to meet basic needs  , Home prepared for child     Psychotropic Medications:         Pediatrician:       Pediatrician List:   [place]      Pediatrician Fax Number:    Risk Factors/Current Problems:  Substance Use  , Mental Health Concerns     Cognitive State:  Able to Concentrate  ,  Insightful  , Alert  , Linear Thinking     Mood/Affect:  Calm     CSW Assessment: CSW received consult for [place] 10, THC use during pregnancy. 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  CSW met with [name] to offer support and complete assessment.    CSW met with [name] at bedside. CSW observed FOB holding and bonding with infant, and [name] eating lunch. CSW offered to return, [name] welcomed CSW. CSW introduced role and offered [name] privacy. FOB left room to give [name] privacy. [name] presented calm and receptive to [place] visit. CSW confirmed that demographic information on file is correct. CSW inquired how [name] has felt since giving birth. [name] express, " bouncing back slowly, doing alright." CSW inquired how [name] felt emotionally during the pregnancy. [name] reports she had some anxiety and felt anxious. [name] disclosed, " I overthink stuff, then the thoughts get worse and then I am feeling anxious. My mother passed away this year so that has been hard, I feel like I have more depression because it." CSW provided active listening and validated [name] feelings. CSW inquired if [name] has received therapy or medication treatment. [name] reports received therapy or medication treatment. CSW discussed therapy options in the area and told [name] about  grief counseling. [name] reports she is open therapy and will look into it. CSW discussed the [place] and asked if [name] had thoughts of harming herself [name] disclosed, " I meant to put no, not yes." CSW assessed [name] for safety. [name] denies thoughts of harm to self and others. [name] denies domestic violence. CSW asked if [name] had experienced postpartum depression. [name] disclosed, "yeah, after my child's father walked out on me." [name] reports she does not have those worries with her current FOB. CSW inquired about [name] coping skills. [name] disclosed, "I watch movies, take walks, and listen to music to focus on other things. I stay busy." CSW asked [name] about her supports. [name] reports since her mom passed away its  FOB, FOB family and her brothers. CSW encouraged [name] to use her supports during this time. CSW provided education regarding the baby blues period vs. perinatal mood disorders, discussed treatment and gave resources for mental health follow up if concerns arise.  CSW recommended [name] complete self-evaluation during the postpartum time period using the New Mom Checklist from Postpartum Progress and encouraged [name] to contact a medical professional if symptoms are noted at any time.  [name] reports she feels comfortable reaching out to her physician if concerns arise.   CSW inquired if [name] used THC during her pregnancy. [name] denied using THC at first, then retracted her statement and said," I can't remember but I used a few months ago." [name] denies using any other substances. CSW informed [name] about the hospital drug screen policy. [name] had no questions. CSW inquired if [name] has CPS history. [name] denies CPS history.   CSW provided review of Sudden Infant Death Syndrome (SIDS) precautions and informed [name] no co-sleeping with the infant. [name] reports the infant will sleep in bassinet. CSW inquired if [name] has essential items for the infant. [name] reports she has essential items. [name] reports she receives WIC/FS services. [name] is still in the process of choosing a pediatrician. [name] reports she will have transportation. CSW offered to make referrals to the community for support. [name] reports she will look into [facility] and call if she wants to use their services. CSW assessed [name] for additional needs. [name] reports no further need.   -CSW will follow infant's UDS and CDS, and make a report to CPS if warranted.  CSW identifies no further need for intervention and no barriers to discharge at this time.    CSW Plan/Description:  Sudden Infant Death Syndrome (SIDS) Education, CSW Will Continue to Monitor Umbilical Cord Tissue Drug Screen Results and Make Report if Warranted, [place], Perinatal Mood  and Anxiety Disorder (PMADs) Education, No Further Intervention Required/No Barriers to Discharge    Lia Hopping, LCSW 12/17/2020, 4:21 PM

## 2020-12-17 NOTE — Progress Notes (Signed)
Spoke with midwife Olegario Messier due to unable to complete Venifir infusion  , Patient has complained of pain at 2 IV sites and now removed. Order received for PO iron

## 2020-12-18 ENCOUNTER — Ambulatory Visit: Payer: Self-pay

## 2020-12-18 MED ORDER — ACETAMINOPHEN 325 MG PO TABS: 650.0000 mg | ORAL_TABLET | Freq: Four times a day (QID) | ORAL | Status: AC | PRN

## 2020-12-18 MED ORDER — FERROUS SULFATE 325 (65 FE) MG PO TABS: 325.0000 mg | ORAL_TABLET | Freq: Two times a day (BID) | ORAL | 3 refills | Status: AC

## 2020-12-18 MED ORDER — OXYCODONE HCL 5 MG PO TABS: 5.0000 mg | ORAL_TABLET | ORAL | 0 refills | Status: AC | PRN

## 2020-12-18 MED ORDER — IBUPROFEN 600 MG PO TABS: 600.0000 mg | ORAL_TABLET | Freq: Four times a day (QID) | ORAL | 0 refills | Status: AC

## 2020-12-18 MED ORDER — COCONUT OIL OIL: 1.0000 "application " | TOPICAL_OIL | 0 refills | Status: AC | PRN

## 2020-12-18 MED ORDER — PRENATAL MULTIVITAMIN CH
1.0000 | ORAL_TABLET | Freq: Every day | ORAL | Status: AC
Start: 2020-12-18 — End: ?

## 2020-12-18 NOTE — Lactation Note (Signed)
This note was copied from a baby's chart. Lactation Consultation Note  Patient Name: Deanna Cooper [id number] Date: 12/18/2020 Reason for consult: Follow-up assessment;Early term 29-38.6wks (Mom is supplementing infant with 22 Similac Neosure 22 kcal formula, ETI infant with weight loss of -5% and less than 5 lbs.) Age:22 hours Per mom, she BF her 1st child for 6 months and this is very different for her, LC explained she hears mom's concerns but due to infant's small size, being ETI infant with-5% weight loss its best to supplement infant to help with infant's growth.  LC entered the room, RN recently finished pace feeding infant 15 mls of Similac Neosure 22 kcal formula with slow flow bottle nipple, prior to supplement mom had BF infant for one hour.  LC did not observe latch. Per mom, infant BF well and sustains latch. LC discussed with mom due to infant's small size and weight loss to limit infant's total feeding to 30 minutes.  LC gave parents LPTI feeding policy. Per mom, she used DEBP maybe twice.  Mom's current feeding plan: 1- Mom will BF infant according to cues, 8 to 12 times within 24 hours, STS,  keping hat on at all times.  2- Mom will limit infant total feeding to 30 minutes, BF infant for 20 minutes and afterwards  supplement infant with EBM and 22 kcal Similac Neosure with iron on Day 2 offering 15- to 20 mls per feeding. 3- Mom understands not to make infant wait to breastfeed or be supplemented with EBM/ formula.  4- Going forward mom will use DEBP every 3 hours for 15 minutes on initial setting, and give infant back any EBM first after BF infant before offering formula. 5- Mom knows to call RN or LC if she has any questions, concerns or need assistance with latching infant at the breast.  Maternal Data    Feeding Mother's Current Feeding Choice: Breast Milk and Formula Nipple Type: Slow - flow  LATCH Score                    Lactation Tools  Discussed/Used    Interventions Interventions: Skin to skin;DEBP;Education  Discharge    Consult Status Consult Status: Follow-up Date: 12/18/20 Follow-up type: In-patient    Danelle Earthly 12/18/2020, 12:00 AM

## 2020-12-18 NOTE — Lactation Note (Signed)
This note was copied from a baby's chart. Lactation Consultation Note  Patient Name: Deanna Cooper [id number] Date: 12/18/2020 Reason for consult: Follow-up assessment;Mother's request;Difficult latch;Early term 37-38.6wks;Infant < 6lbs;Infant weight loss;Other (Comment) (8 % weight loss) Age:22 hours  LC talked with RN, Nila Nephew, noted Mom offering breast and not supplementing with formula/EBM after each feeding. RN noted infant paced bottle fed she took 20 ml with RN.   Infant had 5 urine but no stool today. During visit, Mom stated mostly latching at the breast, last supplement at 9 am with RN. LC reviewed with mother behavior of LPTI given birthweight under 6 lbs. Mom to supplement every single feeding and offer breast at every other feeding given weight loss. Mom to start pumping q 3hrs for 15 min so she can maintain her milk supply and offer eBM first followed by formula with pace bottle feeding.   LC went over pace bottle feeding with mother and fed infant 25 ml of formula.   Mom not started pumping yet so we reviewed pump parts and importance of pumping to maintain milk supply.   Mom to call for latch assistance to ensure nutritive sucking at next feeding.   Plan 1. To feed based on cues 8-12x in 24 hr period no more than 3 hrs without an attempt. Mom to supplement at each feeding and offer breast every other feeding.          2. LC reviewed how to reduce calorie loss for LPTI including keeping total feeding under 30 min. Mom on board to supplement with eBM and formula for every feeding ( 20-30 ml or more infant still showing cues and not latching at breast)            3. Mom to pump using dEBP q 3 hrs for 15 min   All questions answered at the end of the visit.  LC reviewed the plan with mother and RN, Donalynn Furlong.   Maternal Data    Feeding Mother's Current Feeding Choice: Breast Milk and Formula Nipple Type: Other (med slow flow)  LATCH Score                     Lactation Tools Discussed/Used Tools: Pump;Flanges Flange Size: 24 Breast pump type: Double-Electric Breast Pump Pump Education: Setup, frequency, and cleaning;Milk Storage Reason for Pumping: increase stimulation Pumping frequency: every 3 hrs for 15 min  Interventions Interventions: Breast feeding basics reviewed;DEBP;Education  Discharge    Consult Status Consult Status: Follow-up Date: 12/19/20 Follow-up type: In-patient    Deanna Gadd  Cooper 12/18/2020, 7:56 PM

## 2020-12-18 NOTE — Social Work (Signed)
CSW received call from physician inquiring if [name] has a legal guardian, per notes from 2019. [name] informed CSW she did not think she had a legal guardian but was not sure if her mom had filed paperwork .CSW called Deanna Cooper with [place] [employer]. Ms. Cooper informed CSW, per the clerk of courts there is no record stating that [name] has not been adjudicated incompetent and does not have a guardian.   Deanna Cooper, MSW, LCSW [facility]  Clinical Social Worker  [phone] 12/18/2020  3:10 PM  

## 2020-12-19 ENCOUNTER — Ambulatory Visit: Payer: Self-pay

## 2020-12-19 NOTE — Lactation Note (Addendum)
This note was copied from a baby's chart. Lactation Consultation Note  Patient Name: Deanna Cooper [id number] Date: 12/19/2020   Age:22 hours  Mom has not pumped today, but has pumped previously at least 3 times (obtaining volumes of 10-20 mL). Dad reports that the Enfamil slow-flow nipple is too fast & infant did better with the yellow slow-flow nipple (which was then provided to parents).  LC visit interrupted by lab coming in to do PKU & bili draws.  Parents will call out for me when infant is ready to feed again.   [name] [facility] 12/19/2020, 9:33 AM

## 2020-12-19 NOTE — Lactation Note (Signed)
This note was copied from a baby's chart. Lactation Consultation Note  Patient Name: Girl Pamula Luther [id number] Date: 12/19/2020 Reason for consult: Follow-up assessment;Infant < 6lbs;Early term 37-38.6wks Age:22 hours  "Marcelino Duster" was breastfeeding when I entered the room. Infant with spontaneous, frequent swallows (swallows verified by cervical auscultation). When infant released breast, Mom's nipple shape was not distorted. Her nipple shape suggested that she needed a size 21 flange. Two size 21 flanges were provided.  When Mom offers the breast, she only offers 1 breast & then Dad feeds a bottle. I told Mom she could offer both breasts and then offer a bottle, if infant is still cueing.   I told parents that they could use EBM instead of formula, if Mom has it available.   [name] [facility] 12/19/2020, 10:54 AM

## 2020-12-19 NOTE — Lactation Note (Signed)
This note was copied from a baby's chart. Lactation Consultation Note  Patient Name: Deanna Cooper [id number] Date: 12/19/2020 Reason for consult: Follow-up assessment;Early term 37-38.6wks Age:22 hours   Parents were shown how to use manual pump & know how to reach [place] for post-discharge questions.   Dad was not present for SLP consult; I helped to review info left by SLP about nipple comparison.   Coconut oil provided per maternal request.  [name] [facility] 12/19/2020, 1:57 PM

## 2020-12-19 NOTE — Lactation Note (Signed)
This note was copied from a baby's chart. Lactation Consultation Note  Patient Name: Deanna Cooper [id number] Date: 12/19/2020 Reason for consult: Follow-up assessment;Infant < 6lbs;Early term 37-38.6wks Age:22 hours  Parents had reported that Enfamil slow flow nipple was too fast; I observed that the Similac slow flow nipple was also too fast. The Enfamil extra-slow flow nipple was tried & although "Deanna Cooper" did better, it seemed that she may still benefit from a slower flow. I contacted D.McLeod, SLP to come and finish feeding.  Dr. Erik Obey was made aware.  [name] [facility] 12/19/2020, 11:24 AM

## 2020-12-23 ENCOUNTER — Other Ambulatory Visit: Payer: Self-pay

## 2020-12-23 ENCOUNTER — Ambulatory Visit (INDEPENDENT_AMBULATORY_CARE_PROVIDER_SITE_OTHER): Payer: Medicaid Other | Admitting: *Deleted

## 2020-12-23 ENCOUNTER — Encounter: Payer: Medicaid Other | Admitting: Obstetrics and Gynecology

## 2020-12-23 VITALS — BP 133/80 | HR 71 | Temp 99.2°F | Wt 114.0 lb

## 2020-12-23 DIAGNOSIS — Z4889 Encounter for other specified surgical aftercare: Secondary | ICD-10-CM

## 2020-12-23 NOTE — Progress Notes (Signed)
   Subjective:     Deanna Cooper is a 20 y.o. female who presents to the clinic 1 weeks status post  c-section  for  delivery of infant . Eating a regular diet without difficulty. Bowel movements are normal. Pain is controlled with current analgesics. Medications being used: prescription NSAID's including ibuprofen (Motrin) and oxycodone.  The following portions of the patient's history were reviewed and updated as appropriate: allergies, current medications, past family history, past medical history, past social history, past surgical history, and problem list.  Review of Systems Pertinent items are noted in HPI.    Objective:    BP (!) 142/79 (BP Location: Left Arm, Patient Position: Sitting, Cuff Size: Normal)   Pulse 79   Temp 99.2 F (37.3 C) (Oral)   Wt 114 lb (51.7 kg)   LMP 04/01/2020   Breastfeeding Yes   BMI 21.54 kg/m  General:  alert, cooperative, and appears stated age  Abdomen: soft, non-tender  Incision:   healing well, no drainage, no erythema, no hernia, no seroma, no swelling, no dehiscence, incision well approximated     Assessment:    Doing well postoperatively.   Plan:    1. Continue any current medications. 2. Wound care discussed. 3. Activity restrictions: no lifting more than 25 pounds 4. Anticipated return to work: not applicable. 5. Follow up: 5 weeks for postpartum visit.   Clovis Pu, RN

## 2020-12-24 ENCOUNTER — Encounter: Payer: Medicaid Other | Admitting: Obstetrics and Gynecology

## 2020-12-26 ENCOUNTER — Telehealth (HOSPITAL_COMMUNITY): Payer: Self-pay

## 2020-12-26 NOTE — Telephone Encounter (Signed)
No answer. Message left to return nurse call.   Marcelino Duster [facility] 12/26/2020,1536

## 2020-12-29 ENCOUNTER — Ambulatory Visit: Payer: Medicaid Other

## 2020-12-31 ENCOUNTER — Encounter: Payer: Medicaid Other | Admitting: Obstetrics and Gynecology

## 2021-01-01 ENCOUNTER — Ambulatory Visit (INDEPENDENT_AMBULATORY_CARE_PROVIDER_SITE_OTHER): Payer: Medicaid Other | Admitting: *Deleted

## 2021-01-01 ENCOUNTER — Ambulatory Visit: Payer: Medicaid Other

## 2021-01-01 ENCOUNTER — Other Ambulatory Visit: Payer: Self-pay

## 2021-01-01 VITALS — BP 120/75 | HR 109 | Temp 98.5°F | Ht 61.0 in | Wt 109.4 lb

## 2021-01-01 DIAGNOSIS — R030 Elevated blood-pressure reading, without diagnosis of hypertension: Secondary | ICD-10-CM | POA: Diagnosis not present

## 2021-01-01 NOTE — Progress Notes (Signed)
   Subjective:  Deanna Cooper is a 20 y.o. female here for BP check.   Hypertension ROS: no TIA's, no chest pain on exertion, no dyspnea on exertion, no swelling of ankles, no orthostatic dizziness or lightheadedness, no orthopnea or paroxysmal nocturnal dyspnea, no palpitations, and no intermittent claudication symptoms.    Objective:  BP 120/75 (BP Location: Left Arm, Patient Position: Sitting, Cuff Size: Small)   Pulse (!) 109   Temp 98.5 F (36.9 C) (Oral)   Ht 5\' 1"  (1.549 m)   Wt 109 lb 6.4 oz (49.6 kg)   LMP 04/01/2020   Breastfeeding Yes   BMI 20.67 kg/m   Appearance alert, well appearing, and in no distress, oriented to person, place, and time, and normal appearing weight. General exam BP noted to be well controlled today in office.   Received message from 04/03/2020, RN from [facility] discharge follow up to schedule patient for [facility] labs from office visit 12/23/2020. First BP reading 142/79, heart rate 79. Repeat BP reading 133/80, heart rate 71.  Assessment:   Blood Pressure well controlled, stable, improved, and asymptomatic.   Plan:  Current treatment plan is effective, no change in therapy.12/25/2020

## 2021-01-02 LAB — COMPREHENSIVE METABOLIC PANEL
ALT: 13 IU/L (ref 0–32)
AST: 15 IU/L (ref 0–40)
Albumin/Globulin Ratio: 1.5 (ref 1.2–2.2)
Albumin: 4.5 g/dL (ref 3.9–5.0)
Alkaline Phosphatase: 98 IU/L (ref 44–121)
BUN/Creatinine Ratio: 17 (ref 9–23)
BUN: 13 mg/dL (ref 6–20)
Bilirubin Total: 0.3 mg/dL (ref 0.0–1.2)
CO2: 20 mmol/L (ref 20–29)
Calcium: 9.8 mg/dL (ref 8.7–10.2)
Chloride: 103 mmol/L (ref 96–106)
Creatinine, Ser: 0.78 mg/dL (ref 0.57–1.00)
Globulin, Total: 3 g/dL (ref 1.5–4.5)
Glucose: 83 mg/dL (ref 65–99)
Potassium: 4.4 mmol/L (ref 3.5–5.2)
Sodium: 139 mmol/L (ref 134–144)
Total Protein: 7.5 g/dL (ref 6.0–8.5)
eGFR: 111 mL/min/{1.73_m2} (ref >59)

## 2021-01-02 LAB — CBC
Hematocrit: 38.4 % (ref 34.0–46.6)
Hemoglobin: 12.5 g/dL (ref 11.1–15.9)
MCH: 29.7 pg (ref 26.6–33.0)
MCHC: 32.6 g/dL (ref 31.5–35.7)
MCV: 91 fL (ref 79–97)
Platelets: 529 10*3/uL — ABNORMAL HIGH (ref 150–450)
RBC: 4.21 x10E6/uL (ref 3.77–5.28)
RDW: 12.7 % (ref 11.7–15.4)
WBC: 5.9 10*3/uL (ref 3.4–10.8)

## 2021-01-02 LAB — PROTEIN / CREATININE RATIO, URINE
Creatinine, Urine: 70 mg/dL
Protein, Ur: 6.2 mg/dL
Protein/Creat Ratio: 89 mg/g creat (ref 0–200)

## 2021-01-06 ENCOUNTER — Encounter: Payer: Medicaid Other | Admitting: Advanced Practice Midwife

## 2021-01-07 ENCOUNTER — Encounter: Payer: Medicaid Other | Admitting: Obstetrics and Gynecology

## 2021-01-28 ENCOUNTER — Other Ambulatory Visit: Payer: Self-pay

## 2021-01-28 ENCOUNTER — Ambulatory Visit (INDEPENDENT_AMBULATORY_CARE_PROVIDER_SITE_OTHER): Payer: Medicaid Other | Admitting: Obstetrics and Gynecology

## 2021-01-28 ENCOUNTER — Encounter: Payer: Self-pay | Admitting: Obstetrics and Gynecology

## 2021-01-28 DIAGNOSIS — Z3040 Encounter for surveillance of contraceptives, unspecified: Secondary | ICD-10-CM | POA: Diagnosis not present

## 2021-01-28 NOTE — Progress Notes (Signed)
Post Partum Visit Note  Deanna Cooper is a 20 y.o. [id number] female who presents for a postpartum visit. She is 6 weeks postpartum following a repeat cesarean section.  I have fully reviewed the prenatal and intrapartum course. The delivery was at 37.0 gestational weeks.  Anesthesia: epidural. Postpartum course has been uncomplicated. Baby is doing well. Baby is feeding by both breast and bottle - per patient stopped using formula last week due to making infant fussy and constipated . Bleeding no bleeding. Bowel function is normal. Bladder function is normal. Patient is not sexually active. Contraception method is Nexplanon. Postpartum depression screening: positive; score 13.   The pregnancy intention screening data noted above was reviewed. Potential methods of contraception were discussed. The patient elected to proceed with No data recorded.   Edinburgh Postnatal Depression Scale - 01/28/21 1532       Edinburgh Postnatal Depression Scale:  In the Past 7 Days   I have been able to laugh and see the funny side of things. 1    I have looked forward with enjoyment to things. 1    I have blamed myself unnecessarily when things went wrong. 0    I have been anxious or worried for no good reason. 2    I have felt scared or panicky for no good reason. 1    Things have been getting on top of me. 2    I have been so unhappy that I have had difficulty sleeping. 2    I have felt sad or miserable. 2    I have been so unhappy that I have been crying. 2    The thought of harming myself has occurred to me. 0    Edinburgh Postnatal Depression Scale Total 13             Health Maintenance Due  Topic Date Due   COVID-19 Vaccine (1) Never done   Pneumococcal Vaccine 37-82 Years old (1 - PCV) Never done   HPV VACCINES (1 - 2-dose series) Never done   INFLUENZA VACCINE  01/04/2021    The following portions of the patient's history were reviewed and updated as appropriate: allergies, current  medications, past family history, past medical history, past social history, past surgical history, and problem list.  Review of Systems Constitutional: negative Eyes: negative Ears, nose, mouth, throat, and face: negative Respiratory: negative Cardiovascular: negative Gastrointestinal: negative Genitourinary:negative Integument/breast: negative Hematologic/lymphatic: negative Musculoskeletal:negative Neurological: negative Behavioral/Psych: negative Endocrine: negative Allergic/Immunologic: negative  Objective:  BP 106/63 (BP Location: Left Arm, Patient Position: Sitting, Cuff Size: Normal)   Pulse 75   Temp 98.5 F (36.9 C) (Oral)   Ht 5\' 1"  (1.549 m)   Wt 109 lb 6.4 oz (49.6 kg)   LMP 12/16/2020   Breastfeeding Yes   BMI 20.67 kg/m    General:  alert, cooperative, and no distress   Breasts:  normal  Lungs: clear to auscultation bilaterally  Heart:  regular rate and rhythm, S1, S2 normal, no murmur, click, rub or gallop  Abdomen: soft, non-tender; bowel sounds normal; no masses,  no organomegaly   Wound well approximated incision  GU exam:  not indicated       Assessment:   Encounter for postpartum care of lactating mother - Normal postpartum exam.   Encounter for surveillance of contraceptive device  - No complaints with Nexplanon    Plan:   Essential components of care per ACOG recommendations:  1.  Mood and well being: Patient with  positive depression screening today. Reviewed local resources for support. Will schedule with Gwyndolyn Saxon, LCSW - Patient tobacco use? No.   - hx of drug use? No.    2. Infant care and feeding:  -Patient currently breastmilk feeding? Yes. Discussed returning to work and pumping. Reviewed importance of draining breast regularly to support lactation.  -Social determinants of health (SDOH) reviewed in EPIC. No concerns  3. Sexuality, contraception and birth spacing - Patient does not want a pregnancy in the next year.   Desired family size is 2 children.  - Reviewed forms of contraception in tiered fashion. Patient desired  Nexplanon  today.   - Discussed birth spacing of 18 months  4. Sleep and fatigue -Encouraged family/partner/community support of 4 hrs of uninterrupted sleep to help with mood and fatigue  5. Physical Recovery  - Discussed patients delivery and complications. She describes her labor as good. - Patient had a C-section repeat; no problems after deliver. Postoperative healing reviewed. Patient expressed understanding - Patient has urinary incontinence? No. - Patient is safe to resume physical and sexual activity  6.  Health Maintenance - HM due items addressed Yes - Last pap smear  Diagnosis  Date Value Ref Range Status  07/16/2020 (A)  Final   - Atypical squamous cells of undetermined significance (ASC-US)   Pap smear not done at today's visit.  -Breast Cancer screening indicated? No.   7. Chronic Disease/Pregnancy Condition follow up:  DepressionF/U with [facility] provider  - PCP follow up  Raelyn Mora, CNM [facility]

## 2021-02-02 ENCOUNTER — Encounter: Payer: Self-pay | Admitting: Obstetrics and Gynecology

## 2021-04-26 ENCOUNTER — Encounter: Payer: Self-pay | Admitting: Obstetrics and Gynecology

## 2021-06-18 IMAGING — US US OB COMP LESS 14 WK
1 series · 15 of 28 positions shown · non-contrast
Comparison: Ob ultrasound 05/27/2020

CLINICAL DATA: Confirming dating/viability.

EXAM:
OBSTETRIC <14 WK ULTRASOUND
TECHNIQUE: Transabdominal ultrasound was performed for evaluation of the
gestation as well as the maternal uterus and adnexal regions.

[Series 1: us ob comp less 14 wk · 66 acquisitions, 15 frames shown]
[im 1/66]
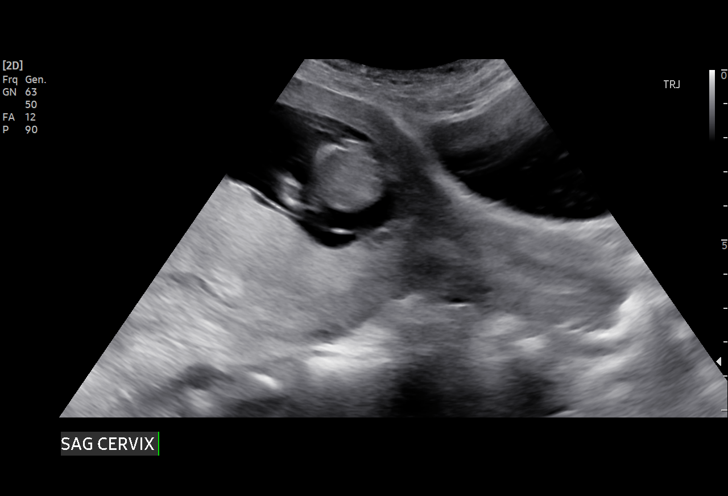
[im 5/66]
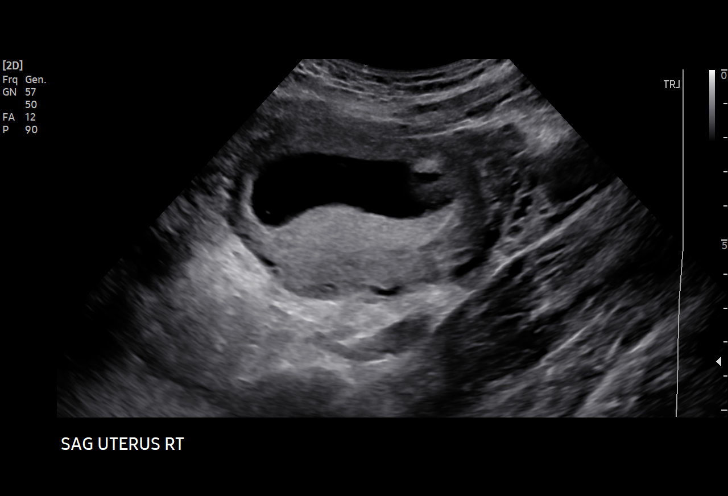
[im 10/66]
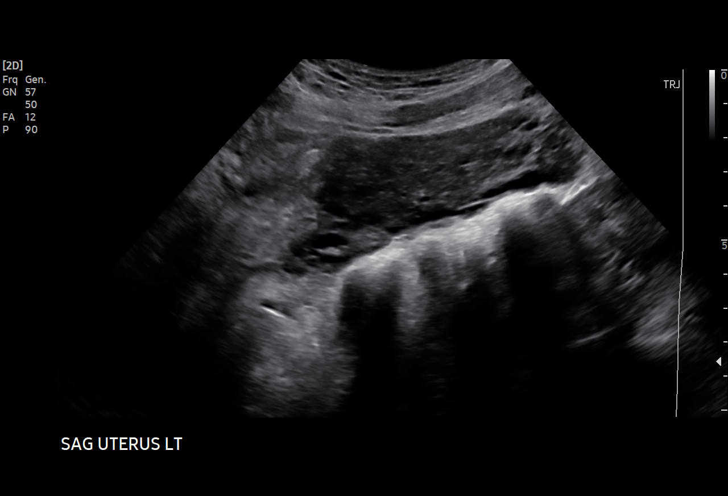
[im 15/66]
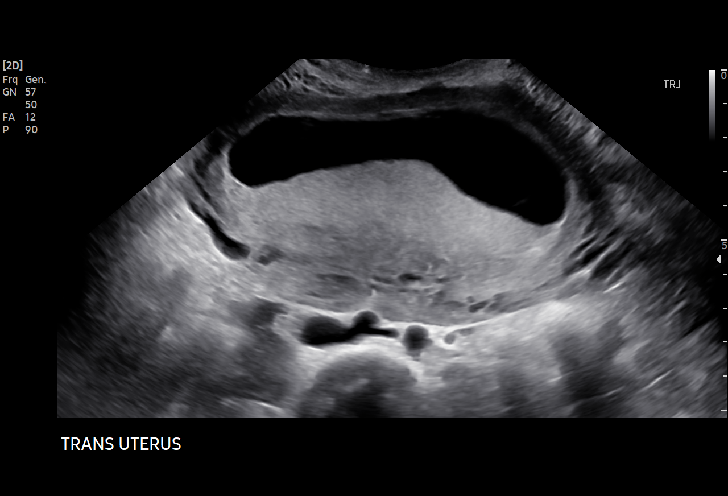
[im 20/66]
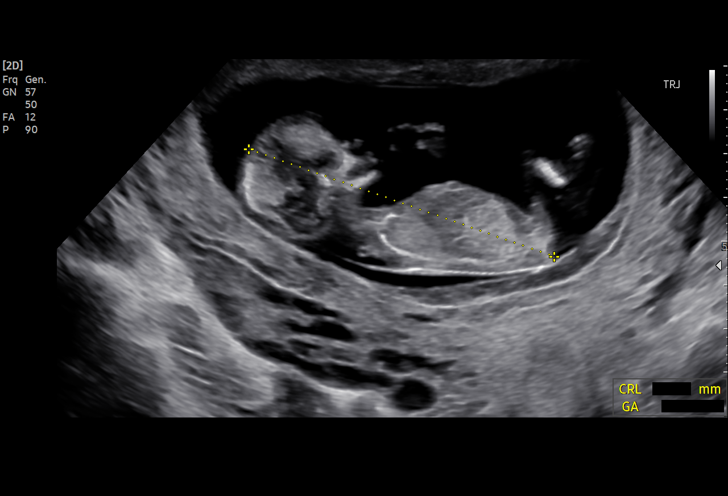
[im 25/66]
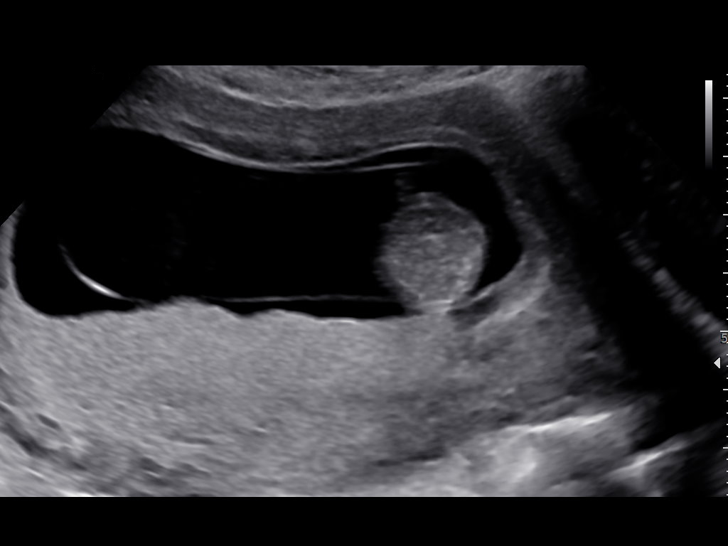
[im 29/66]
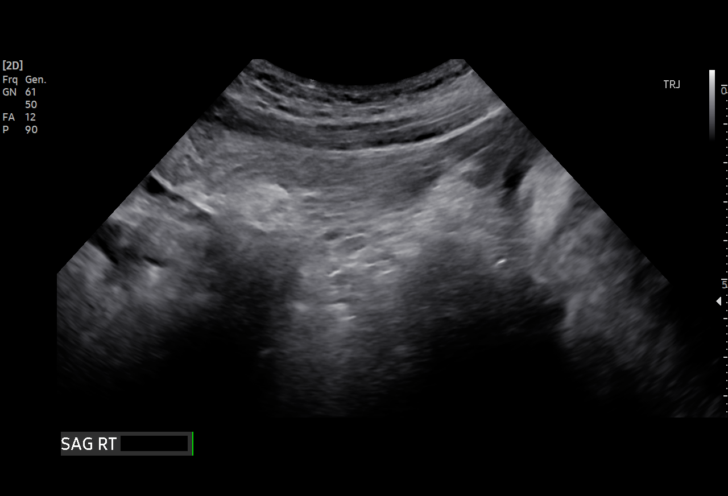
[im 34/66]
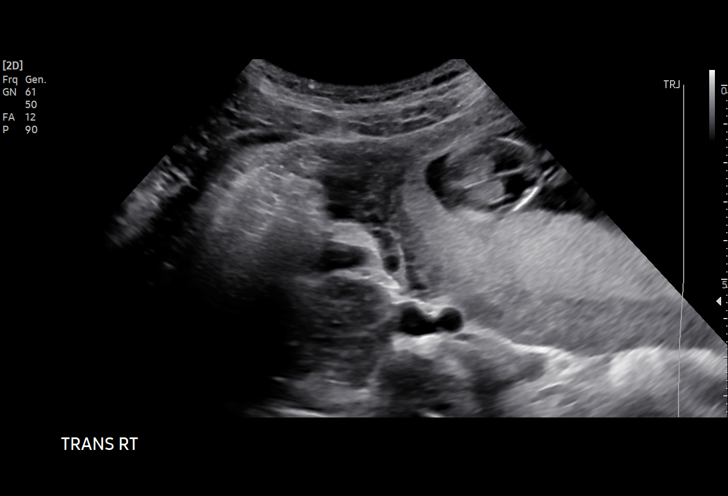
[im 37/66]
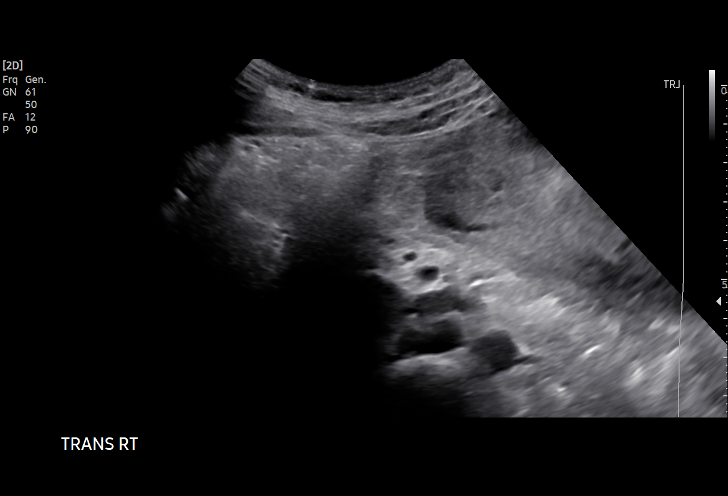
[im 41/66]
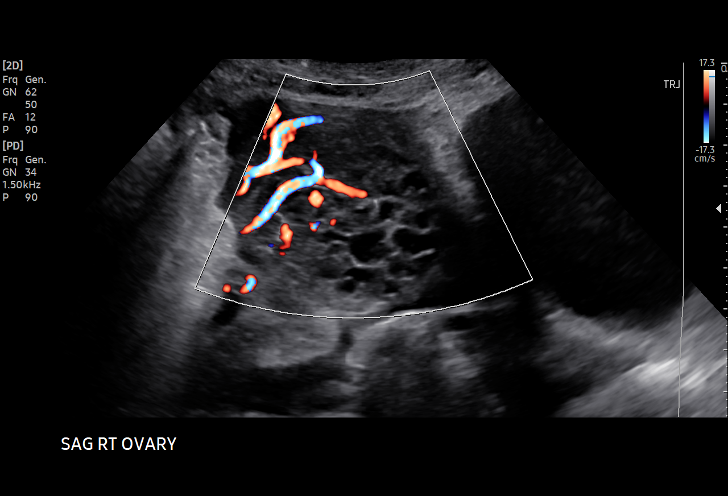
[im 46/66]
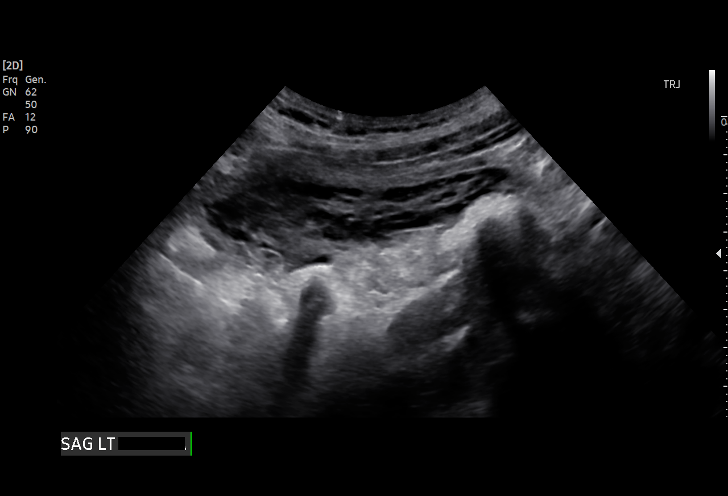
[im 51/66]
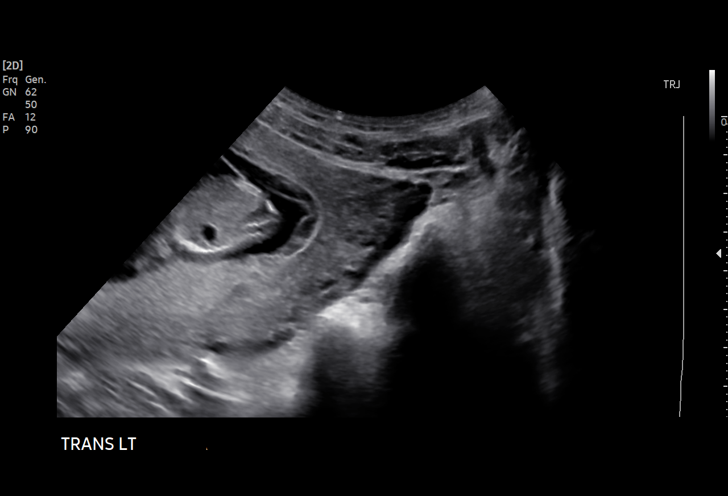
[im 56/66]
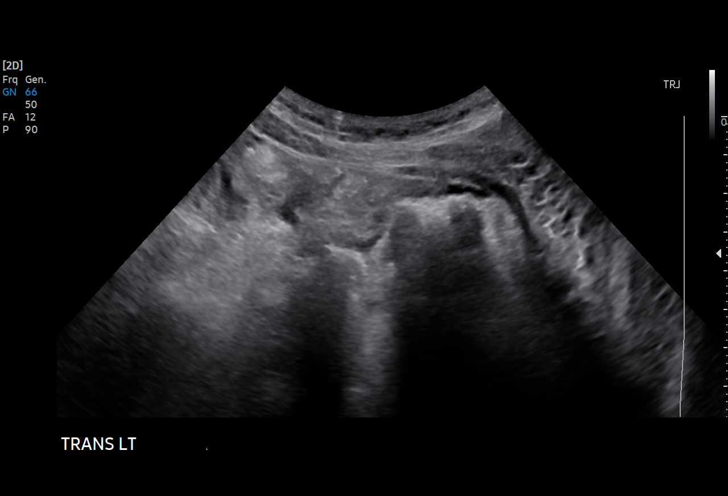
[im 61/66]
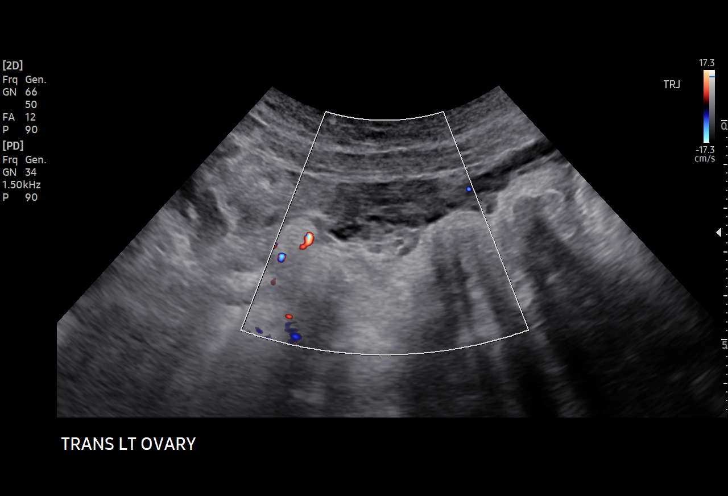
[im 66/66]
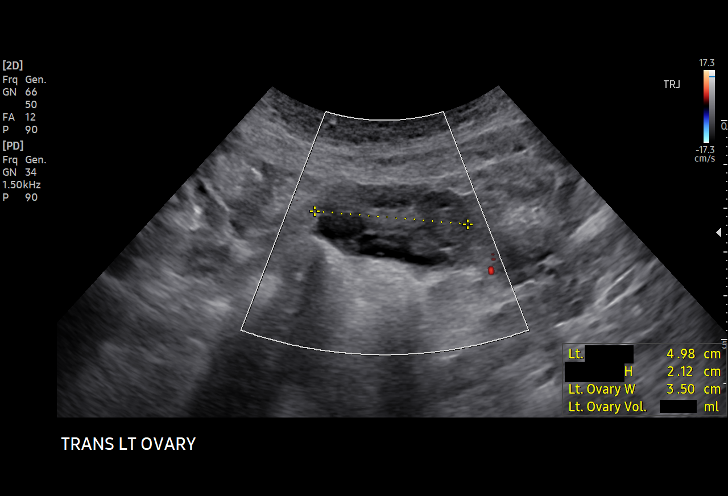

[15 of 28 positions shown; findings below may reference images not displayed]

FINDINGS: Intrauterine gestational sac: Single

Yolk sac:  Not Visualized.

Embryo:  Visualized.

Cardiac Activity: Visualized.

Heart Rate: 155 bpm

CRL:   74.2 mm   13 w 4 d                  US EDC: 01/03/2021

Subchorionic hemorrhage:  None visualized.

Maternal uterus/adnexae: Normal in appearance. No free fluid in the
pelvis.
IMPRESSION: Single viable intrauterine pregnancy with estimated gestational age
of 13 weeks 4 days.

## 2021-10-07 DIAGNOSIS — F410 Panic disorder [episodic paroxysmal anxiety] without agoraphobia: Secondary | ICD-10-CM | POA: Diagnosis not present

## 2021-10-08 ENCOUNTER — Telehealth: Payer: Self-pay

## 2021-10-08 NOTE — Telephone Encounter (Signed)
Transition Care Management Unsuccessful Follow-up Telephone Call ? ?Date of discharge and from where:  10/07/2021-[facility] ? ?Attempts:  1st Attempt ? ?Reason for unsuccessful TCM follow-up call:  Missing or invalid number ? ?  ?

## 2021-10-11 NOTE — Telephone Encounter (Signed)
Transition Care Management Unsuccessful Follow-up Telephone Call ? ?Date of discharge and from where:  10/07/2021 [facility] ? ?Attempts:  2nd Attempt ? ?Reason for unsuccessful TCM follow-up call:  Missing or invalid number ? ? ? ?

## 2021-10-12 NOTE — Telephone Encounter (Signed)
Transition Care Management Unsuccessful Follow-up Telephone Call ? ?Date of discharge and from where:  10/07/2021 [facility] ?  ? ?Attempts:  3rd Attempt ? ?Reason for unsuccessful TCM follow-up call:  Unable to reach patient ? ? ? ?

## 2021-10-28 DIAGNOSIS — N760 Acute vaginitis: Secondary | ICD-10-CM | POA: Diagnosis not present

## 2021-10-28 DIAGNOSIS — B9689 Other specified bacterial agents as the cause of diseases classified elsewhere: Secondary | ICD-10-CM | POA: Diagnosis not present

## 2021-10-28 DIAGNOSIS — Z113 Encounter for screening for infections with a predominantly sexual mode of transmission: Secondary | ICD-10-CM | POA: Diagnosis not present

## 2021-10-28 DIAGNOSIS — Z0000 Encounter for general adult medical examination without abnormal findings: Secondary | ICD-10-CM | POA: Diagnosis not present

## 2021-10-28 DIAGNOSIS — Z01419 Encounter for gynecological examination (general) (routine) without abnormal findings: Secondary | ICD-10-CM | POA: Diagnosis not present

## 2021-12-21 DIAGNOSIS — Z113 Encounter for screening for infections with a predominantly sexual mode of transmission: Secondary | ICD-10-CM | POA: Diagnosis not present

## 2021-12-21 DIAGNOSIS — Z975 Presence of (intrauterine) contraceptive device: Secondary | ICD-10-CM | POA: Diagnosis not present

## 2021-12-21 DIAGNOSIS — N760 Acute vaginitis: Secondary | ICD-10-CM | POA: Diagnosis not present

## 2021-12-21 DIAGNOSIS — B9689 Other specified bacterial agents as the cause of diseases classified elsewhere: Secondary | ICD-10-CM | POA: Diagnosis not present
